# Patient Record
Sex: Male | Born: 1965 | Race: White | Hispanic: No | Marital: Married | Smoking: Current every day smoker
Health system: Southern US, Community
[De-identification: ages and names within clinical notes are randomized; demographics above are authoritative.]

## PROBLEM LIST (undated history)

## (undated) DIAGNOSIS — E119 Type 2 diabetes mellitus without complications: Secondary | ICD-10-CM

## (undated) DIAGNOSIS — E785 Hyperlipidemia, unspecified: Secondary | ICD-10-CM

## (undated) HISTORY — PX: OTHER SURGICAL HISTORY: SHX169

## (undated) HISTORY — DX: Hyperlipidemia, unspecified: E78.5

---

## 2010-03-19 ENCOUNTER — Emergency Department (HOSPITAL_COMMUNITY): Admission: EM | Admit: 2010-03-19 | Discharge: 2010-03-19 | Payer: Self-pay | Admitting: Emergency Medicine

## 2010-03-22 ENCOUNTER — Emergency Department (HOSPITAL_COMMUNITY): Admission: EM | Admit: 2010-03-22 | Discharge: 2010-03-22 | Payer: Self-pay | Admitting: Emergency Medicine

## 2011-10-18 ENCOUNTER — Emergency Department (HOSPITAL_COMMUNITY): Payer: PRIVATE HEALTH INSURANCE

## 2011-10-18 ENCOUNTER — Encounter: Payer: Self-pay | Admitting: *Deleted

## 2011-10-18 ENCOUNTER — Emergency Department (HOSPITAL_COMMUNITY)
Admission: EM | Admit: 2011-10-18 | Discharge: 2011-10-18 | Disposition: A | Discharge status: Home / Self Care | Payer: PRIVATE HEALTH INSURANCE | Attending: Emergency Medicine | Admitting: Emergency Medicine

## 2011-10-18 DIAGNOSIS — F17200 Nicotine dependence, unspecified, uncomplicated: Secondary | ICD-10-CM | POA: Insufficient documentation

## 2011-10-18 DIAGNOSIS — M79609 Pain in unspecified limb: Secondary | ICD-10-CM | POA: Insufficient documentation

## 2011-10-18 DIAGNOSIS — W2209XA Striking against other stationary object, initial encounter: Secondary | ICD-10-CM | POA: Insufficient documentation

## 2011-10-18 DIAGNOSIS — S60221A Contusion of right hand, initial encounter: Secondary | ICD-10-CM

## 2011-10-18 DIAGNOSIS — S60229A Contusion of unspecified hand, initial encounter: Secondary | ICD-10-CM | POA: Insufficient documentation

## 2011-10-18 NOTE — ED Notes (Signed)
Struck fist against wall 3-4 weeks ago. Cont to hurt when moves hand

## 2011-10-18 NOTE — ED Provider Notes (Signed)
History     CSN: [id number]  Arrival date & time 10/18/11  1238   First MD Initiated Contact with Patient 10/18/11 1304      Chief Complaint  Patient presents with  . Hand Injury    (Consider location/radiation/quality/duration/timing/severity/associated sxs/prior treatment) Patient is a 45 y.o. male presenting with hand injury. The history is provided by the patient. No language interpreter was used.  Hand Injury  Incident onset: 1 month ago. The incident occurred at home. Injury mechanism: punched a wall.  just started hurting a couple days ago. The pain is present in the right hand. The quality of the pain is described as sharp. The pain is at a severity of 6/10. The pain has been intermittent since the incident. He reports no foreign bodies present. The symptoms are aggravated by movement and palpation. He has tried nothing for the symptoms.    History reviewed. No pertinent past medical history.  History reviewed. No pertinent past surgical history.  History reviewed. No pertinent family history.  History  Substance Use Topics  . Smoking status: Current Everyday Smoker    Types: Cigarettes  . Smokeless tobacco: Not on file  . Alcohol Use: Yes      Review of Systems  Musculoskeletal:       Hand inj  All other systems reviewed and are negative.    Allergies  Review of patient's allergies indicates no known allergies.  Home Medications  No current outpatient prescriptions on file.  BP 142/85  Pulse 101  Temp 98.5 F (36.9 C)  Resp 20  Wt 218 lb (98.884 kg)  SpO2 99%  Physical Exam  Nursing note and vitals reviewed. Constitutional: He is oriented to person, place, and time. He appears well-developed and well-nourished.  HENT:  Head: Normocephalic and atraumatic.  Eyes: EOM are normal.  Neck: Normal range of motion.  Cardiovascular: Normal rate, regular rhythm, normal heart sounds and intact distal pulses.   Pulmonary/Chest: Effort normal and breath  sounds normal. No respiratory distress.  Abdominal: Soft. He exhibits no distension. There is no tenderness.  Musculoskeletal: He exhibits tenderness.       Right hand: He exhibits decreased range of motion, tenderness and bony tenderness. He exhibits no deformity, no laceration and no swelling. normal sensation noted. Normal strength noted.       Hands: Neurological: He is alert and oriented to person, place, and time.  Skin: Skin is warm and dry.  Psychiatric: He has a normal mood and affect. Judgment normal.    ED Course  Procedures (including critical care time)  Labs Reviewed - No data to display Dg Hand Complete Right  10/18/2011  *RADIOLOGY REPORT*  Clinical Data: Hit wall with wrist 3-4 weeks ago, persistent pain  RIGHT HAND - COMPLETE 3+ VIEW  Comparison: None.  Findings: No acute fracture is seen.   The radiocarpal joint space appears normal and the ulnar styloid is intact.  The carpal bones are in normal position.  Joint spaces appear normal.  IMPRESSION: No acute fracture.  Original Report Authenticated By: Juline Patch, M.D.     No diagnosis found.    MDM          Worthy Rancher, PA 10/18/11 1450

## 2011-10-18 NOTE — ED Provider Notes (Signed)
Medical screening examination/treatment/procedure(s) were performed by non-physician practitioner and as supervising physician I was immediately available for consultation/collaboration.  Nicoletta Dress. Colon Branch, MD 10/18/11 2133

## 2011-10-18 NOTE — ED Notes (Signed)
Pain rt hand ,  Hurts to move.

## 2013-03-06 ENCOUNTER — Other Ambulatory Visit: Payer: Self-pay

## 2013-03-06 MED ORDER — METFORMIN HCL 1000 MG PO TABS: 1000.0000 mg | ORAL_TABLET | Freq: Two times a day (BID) | ORAL | Status: DC

## 2013-03-06 NOTE — Telephone Encounter (Signed)
Patient has not been seen since 03/29/12

## 2013-04-12 ENCOUNTER — Other Ambulatory Visit: Payer: Self-pay | Admitting: Nurse Practitioner

## 2013-04-14 NOTE — Telephone Encounter (Signed)
LAST LABS 6/13. NTBS 

## 2013-05-21 ENCOUNTER — Other Ambulatory Visit: Payer: Self-pay | Admitting: Family Medicine

## 2013-05-23 NOTE — Telephone Encounter (Signed)
Last seen and last glucose  03/29/12  ACM

## 2013-05-24 ENCOUNTER — Encounter (HOSPITAL_COMMUNITY): Payer: Self-pay | Admitting: *Deleted

## 2013-05-24 ENCOUNTER — Emergency Department (HOSPITAL_COMMUNITY)
Admission: EM | Admit: 2013-05-24 | Discharge: 2013-05-24 | Disposition: A | Discharge status: Home / Self Care | Payer: Self-pay | Attending: Emergency Medicine | Admitting: Emergency Medicine

## 2013-05-24 ENCOUNTER — Emergency Department (HOSPITAL_COMMUNITY): Payer: Self-pay

## 2013-05-24 DIAGNOSIS — E119 Type 2 diabetes mellitus without complications: Secondary | ICD-10-CM | POA: Insufficient documentation

## 2013-05-24 DIAGNOSIS — Z79899 Other long term (current) drug therapy: Secondary | ICD-10-CM | POA: Insufficient documentation

## 2013-05-24 DIAGNOSIS — F17200 Nicotine dependence, unspecified, uncomplicated: Secondary | ICD-10-CM | POA: Insufficient documentation

## 2013-05-24 DIAGNOSIS — M5030 Other cervical disc degeneration, unspecified cervical region: Secondary | ICD-10-CM | POA: Insufficient documentation

## 2013-05-24 DIAGNOSIS — Z87828 Personal history of other (healed) physical injury and trauma: Secondary | ICD-10-CM | POA: Insufficient documentation

## 2013-05-24 DIAGNOSIS — R209 Unspecified disturbances of skin sensation: Secondary | ICD-10-CM | POA: Insufficient documentation

## 2013-05-24 DIAGNOSIS — M5412 Radiculopathy, cervical region: Secondary | ICD-10-CM | POA: Insufficient documentation

## 2013-05-24 DIAGNOSIS — M542 Cervicalgia: Secondary | ICD-10-CM | POA: Insufficient documentation

## 2013-05-24 DIAGNOSIS — M4722 Other spondylosis with radiculopathy, cervical region: Secondary | ICD-10-CM

## 2013-05-24 HISTORY — DX: Type 2 diabetes mellitus without complications: E11.9

## 2013-05-24 MED ORDER — HYDROCODONE-ACETAMINOPHEN 5-325 MG PO TABS: 1.0000 | ORAL_TABLET | ORAL | Status: DC | PRN

## 2013-05-24 MED ORDER — PREDNISONE 10 MG PO TABS: 20.0000 mg | ORAL_TABLET | Freq: Two times a day (BID) | ORAL | Status: DC

## 2013-05-24 NOTE — ED Notes (Signed)
Pt reporting pain in right shoulder, arm, and elbow for about 4 weeks.  Also reports occasional numbness in fingers of right hand.
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
## 2013-05-27 NOTE — ED Provider Notes (Signed)
Medical screening examination/treatment/procedure(s) were performed by non-physician practitioner and as supervising physician I was immediately available for consultation/collaboration.   Allyce Bochicchio M Timmie Dugue, DO 05/27/13 1305 

## 2013-06-25 ENCOUNTER — Other Ambulatory Visit: Payer: Self-pay | Admitting: Family Medicine

## 2013-06-26 NOTE — Telephone Encounter (Signed)
Last glucose 03/29/12  ACM

## 2013-07-02 ENCOUNTER — Ambulatory Visit (INDEPENDENT_AMBULATORY_CARE_PROVIDER_SITE_OTHER): Payer: BC Managed Care – PPO | Admitting: Family Medicine

## 2013-07-02 ENCOUNTER — Encounter: Payer: Self-pay | Admitting: Family Medicine

## 2013-07-02 VITALS — BP 123/75 | HR 79 | Temp 98.2°F | Ht 70.0 in | Wt 202.0 lb

## 2013-07-02 DIAGNOSIS — Z0000 Encounter for general adult medical examination without abnormal findings: Secondary | ICD-10-CM

## 2013-07-02 DIAGNOSIS — E119 Type 2 diabetes mellitus without complications: Secondary | ICD-10-CM

## 2013-07-02 DIAGNOSIS — N529 Male erectile dysfunction, unspecified: Secondary | ICD-10-CM

## 2013-07-02 LAB — POCT CBC
Granulocyte percent: 67.5 %G (ref 37–80)
HCT, POC: 46.9 % (ref 43.5–53.7)
Hemoglobin: 15.7 g/dL (ref 14.1–18.1)
Lymph, poc: 2.2 (ref 0.6–3.4)
MCH, POC: 32.1 pg — AB (ref 27–31.2)
MCHC: 33.6 g/dL (ref 31.8–35.4)
MCV: 95.4 fL (ref 80–97)
MPV: 7.3 fL (ref 0–99.8)
POC Granulocyte: 5.1 (ref 2–6.9)
POC LYMPH PERCENT: 28.5 %L (ref 10–50)
Platelet Count, POC: 234 10*3/uL (ref 142–424)
RBC: 4.9 M/uL (ref 4.69–6.13)
RDW, POC: 12.9 %
WBC: 7.6 10*3/uL (ref 4.6–10.2)

## 2013-07-02 LAB — POCT GLYCOSYLATED HEMOGLOBIN (HGB A1C): Hemoglobin A1C: 5.9

## 2013-07-02 MED ORDER — METFORMIN HCL 1000 MG PO TABS: 1000.0000 mg | ORAL_TABLET | Freq: Every day | ORAL | Status: DC

## 2013-07-02 MED ORDER — SILDENAFIL CITRATE 100 MG PO TABS: 50.0000 mg | ORAL_TABLET | Freq: Every day | ORAL | Status: DC | PRN

## 2013-07-02 NOTE — Patient Instructions (Addendum)
Smoking Cessation Quitting smoking is important to your health and has many advantages. However, it is not always easy to quit since nicotine is a very addictive drug. Often times, people try 3 times or more before being able to quit. This document explains the best ways for you to prepare to quit smoking. Quitting takes hard work and a lot of effort, but you can do it. ADVANTAGES OF QUITTING SMOKING  You will live longer, feel better, and live better.  Your body will feel the impact of quitting smoking almost immediately.  Within 20 minutes, blood pressure decreases. Your pulse returns to its normal level.  After 8 hours, carbon monoxide levels in the blood return to normal. Your oxygen level increases.  After 24 hours, the chance of having a heart attack starts to decrease. Your breath, hair, and body stop smelling like smoke.  After 48 hours, damaged nerve endings begin to recover. Your sense of taste and smell improve.  After 72 hours, the body is virtually free of nicotine. Your bronchial tubes relax and breathing becomes easier.  After 2 to 12 weeks, lungs can hold more air. Exercise becomes easier and circulation improves.  The risk of having a heart attack, stroke, cancer, or lung disease is greatly reduced.  After 1 year, the risk of coronary heart disease is cut in half.  After 5 years, the risk of stroke falls to the same as a nonsmoker.  After 10 years, the risk of lung cancer is cut in half and the risk of other cancers decreases significantly.  After 15 years, the risk of coronary heart disease drops, usually to the level of a nonsmoker.  If you are pregnant, quitting smoking will improve your chances of having a healthy baby.  The people you live with, especially any children, will be healthier.  You will have extra money to spend on things other than cigarettes. QUESTIONS TO THINK ABOUT BEFORE ATTEMPTING TO QUIT You may want to talk about your answers with your  caregiver.  Why do you want to quit?  If you tried to quit in the past, what helped and what did not?  What will be the most difficult situations for you after you quit? How will you plan to handle them?  Who can help you through the tough times? Your family? Friends? A caregiver?  What pleasures do you get from smoking? What ways can you still get pleasure if you quit? Here are some questions to ask your caregiver:  How can you help me to be successful at quitting?  What medicine do you think would be best for me and how should I take it?  What should I do if I need more help?  What is smoking withdrawal like? How can I get information on withdrawal? GET READY  Set a quit date.  Change your environment by getting rid of all cigarettes, ashtrays, matches, and lighters in your home, car, or work. Do not let people smoke in your home.  Review your past attempts to quit. Think about what worked and what did not. GET SUPPORT AND ENCOURAGEMENT You have a better chance of being successful if you have help. You can get support in many ways.  Tell your family, friends, and co-workers that you are going to quit and need their support. Ask them not to smoke around you.  Get individual, group, or telephone counseling and support. Programs are available at local hospitals and health centers. Call your local health department for   information about programs in your area.  Spiritual beliefs and practices may help some smokers quit.  Download a "quit meter" on your computer to keep track of quit statistics, such as how long you have gone without smoking, cigarettes not smoked, and money saved.  Get a self-help book about quitting smoking and staying off of tobacco. LEARN NEW SKILLS AND BEHAVIORS  Distract yourself from urges to smoke. Talk to someone, go for a walk, or occupy your time with a task.  Change your normal routine. Take a different route to work. Drink tea instead of coffee.  Eat breakfast in a different place.  Reduce your stress. Take a hot bath, exercise, or read a book.  Plan something enjoyable to do every day. Reward yourself for not smoking.  Explore interactive web-based programs that specialize in helping you quit. GET MEDICINE AND USE IT CORRECTLY Medicines can help you stop smoking and decrease the urge to smoke. Combining medicine with the above behavioral methods and support can greatly increase your chances of successfully quitting smoking.  Nicotine replacement therapy helps deliver nicotine to your body without the negative effects and risks of smoking. Nicotine replacement therapy includes nicotine gum, lozenges, inhalers, nasal sprays, and skin patches. Some may be available over-the-counter and others require a prescription.  Antidepressant medicine helps people abstain from smoking, but how this works is unknown. This medicine is available by prescription.  Nicotinic receptor partial agonist medicine simulates the effect of nicotine in your brain. This medicine is available by prescription. Ask your caregiver for advice about which medicines to use and how to use them based on your health history. Your caregiver will tell you what side effects to look out for if you choose to be on a medicine or therapy. Carefully read the information on the package. Do not use any other product containing nicotine while using a nicotine replacement product.  RELAPSE OR DIFFICULT SITUATIONS Most relapses occur within the first 3 months after quitting. Do not be discouraged if you start smoking again. Remember, most people try several times before finally quitting. You may have symptoms of withdrawal because your body is used to nicotine. You may crave cigarettes, be irritable, feel very hungry, cough often, get headaches, or have difficulty concentrating. The withdrawal symptoms are only temporary. They are strongest when you first quit, but they will go away within  10 14 days. To reduce the chances of relapse, try to:  Avoid drinking alcohol. Drinking lowers your chances of successfully quitting.  Reduce the amount of caffeine you consume. Once you quit smoking, the amount of caffeine in your body increases and can give you symptoms, such as a rapid heartbeat, sweating, and anxiety.  Avoid smokers because they can make you want to smoke.  Do not let weight gain distract you. Many smokers will gain weight when they quit, usually less than 10 pounds. Eat a healthy diet and stay active. You can always lose the weight gained after you quit.  Find ways to improve your mood other than smoking. FOR MORE INFORMATION  www.smokefree.gov  Document Released: 10/03/2001 Document Revised: 04/09/2012 Document Reviewed: 01/18/2012 [employer] Patient Information 2014 [employer].  

## 2013-07-02 NOTE — Progress Notes (Signed)
  Subjective:    Patient ID: Joshua Dudley, male    DOB: December 04, 1965, 48 y.o.   MRN: [id number]  HPI This 47 y.o. male presents for evaluation of diabetes.  He has Been taking metformin 1000mg  po bid and needs refills.  He has not Had labs in over a year.  He is a social drinker and drinks 2 beers after work Each day.  He is a cigarette smoker.   Review of Systems No chest pain, SOB, HA, dizziness, vision change, N/V, diarrhea, constipation, dysuria, urinary urgency or frequency, myalgias, arthralgias or rash.     Objective:   Physical Exam Vital signs noted  Well developed well nourished male.  HEENT - Head atraumatic Normocephalic                Eyes - PERRLA, Conjuctiva - clear Sclera- Clear EOMI                Ears - EAC's Wnl TM's Wnl Gross Hearing WNL                Nose - Nares patent                 Throat - oropharanx wnl Respiratory - Lungs CTA bilateral Cardiac - RRR S1 and S2 without murmur GI - Abdomen soft Nontender and bowel sounds active x 4 Extremities - No edema. Neuro - Grossly intact.       Assessment & Plan:  Diabetes - Plan: metFORMIN (GLUCOPHAGE) 1000 MG tablet, POCT glycosylated hemoglobin (Hb A1C), sildenafil (VIAGRA) 100 MG tablet  Routine general medical examination at a health care facility - Plan: POCT CBC, CMP14+EGFR, Lipid panel, PSA, total and free, Thyroid Panel With TSH, sildenafil (VIAGRA) 100 MG tablet. Discussed DC smoking.  Erectile dysfunction - Plan: sildenafil (VIAGRA) 100 MG tablet  Follow up in 4 months.

## 2013-07-03 ENCOUNTER — Other Ambulatory Visit: Payer: Self-pay | Admitting: Family Medicine

## 2013-07-03 LAB — CMP14+EGFR
ALT: 20 IU/L (ref 0–44)
AST: 16 IU/L (ref 0–40)
Albumin/Globulin Ratio: 1.8 (ref 1.1–2.5)
Albumin: 4.5 g/dL (ref 3.5–5.5)
Alkaline Phosphatase: 71 IU/L (ref 39–117)
BUN/Creatinine Ratio: 11 (ref 9–20)
BUN: 9 mg/dL (ref 6–24)
CO2: 26 mmol/L (ref 18–29)
Calcium: 9.7 mg/dL (ref 8.7–10.2)
Chloride: 98 mmol/L (ref 97–108)
Creatinine, Ser: 0.82 mg/dL (ref 0.76–1.27)
GFR calc Af Amer: 121 mL/min/{1.73_m2} (ref >59)
GFR calc non Af Amer: 105 mL/min/{1.73_m2} (ref >59)
Globulin, Total: 2.5 g/dL (ref 1.5–4.5)
Glucose: 116 mg/dL — ABNORMAL HIGH (ref 65–99)
Potassium: 5.1 mmol/L (ref 3.5–5.2)
Sodium: 139 mmol/L (ref 134–144)
Total Bilirubin: 0.5 mg/dL (ref 0.0–1.2)
Total Protein: 7 g/dL (ref 6.0–8.5)

## 2013-07-03 LAB — PSA, TOTAL AND FREE
PSA, Free Pct: 20 %
PSA, Free: 0.14 ng/mL
PSA: 0.7 ng/mL (ref 0.0–4.0)

## 2013-07-03 LAB — LIPID PANEL
Chol/HDL Ratio: 5.3 ratio units — ABNORMAL HIGH (ref 0.0–5.0)
Cholesterol, Total: 234 mg/dL — ABNORMAL HIGH (ref 100–199)
HDL: 44 mg/dL (ref >39)
LDL Calculated: 154 mg/dL — ABNORMAL HIGH (ref 0–99)
Triglycerides: 180 mg/dL — ABNORMAL HIGH (ref 0–149)
VLDL Cholesterol Cal: 36 mg/dL (ref 5–40)

## 2013-07-03 LAB — THYROID PANEL WITH TSH
Free Thyroxine Index: 1.5 (ref 1.2–4.9)
T3 Uptake Ratio: 25 % (ref 24–39)
T4, Total: 5.9 ug/dL (ref 4.5–12.0)
TSH: 1.94 u[IU]/mL (ref 0.450–4.500)

## 2013-07-03 MED ORDER — PRAVASTATIN SODIUM 40 MG PO TABS: 40.0000 mg | ORAL_TABLET | Freq: Every day | ORAL | Status: DC

## 2013-10-01 ENCOUNTER — Ambulatory Visit: Payer: BC Managed Care – PPO | Admitting: Family Medicine

## 2014-09-30 ENCOUNTER — Other Ambulatory Visit (HOSPITAL_COMMUNITY): Payer: Self-pay | Admitting: Respiratory Therapy

## 2014-09-30 DIAGNOSIS — G4730 Sleep apnea, unspecified: Secondary | ICD-10-CM

## 2014-10-09 ENCOUNTER — Ambulatory Visit: Payer: BC Managed Care – PPO | Attending: Neurology | Admitting: Sleep Medicine

## 2014-10-09 DIAGNOSIS — Z79899 Other long term (current) drug therapy: Secondary | ICD-10-CM | POA: Diagnosis not present

## 2014-10-09 DIAGNOSIS — G4730 Sleep apnea, unspecified: Secondary | ICD-10-CM

## 2014-10-09 DIAGNOSIS — G4733 Obstructive sleep apnea (adult) (pediatric): Secondary | ICD-10-CM | POA: Insufficient documentation

## 2014-10-14 NOTE — Sleep Study (Signed)
  [place] A. Merlene Laughter, MD     [email or web address]        NOCTURNAL POLYSOMNOGRAM    LOCATION: SLEEP LAB FACILITY: [facility]   PHYSICIAN: Marlean Mortell A. Merlene Laughter, M.D.   DATE OF STUDY: 10/09/2014.   REFERRING PHYSICIAN: Wiliam Cauthorn.  INDICATIONS: The patient is a 48 year old man who presents with snoring, witnessed apnea and daytime fatigue.   MEDICATIONS:  Prior to Admission medications   Medication Sig Start Date End Date Taking? Authorizing Provider  metFORMIN (GLUCOPHAGE) 1000 MG tablet Take 1 tablet (1,000 mg total) by mouth daily. 07/02/13   Lysbeth Penner, FNP  pravastatin (PRAVACHOL) 40 MG tablet Take 1 tablet (40 mg total) by mouth daily. 07/03/13   Lysbeth Penner, FNP  sildenafil (VIAGRA) 100 MG tablet Take 0.5-1 tablets (50-100 mg total) by mouth daily as needed for erectile dysfunction. 07/02/13   Lysbeth Penner, FNP      EPWORTH SLEEPINESS SCALE: 17.   BMI: 30.   ARCHITECTURAL SUMMARY: Total recording time was 407 minutes. Sleep efficiency 90 %. Sleep latency 9 minutes. REM latency 83 minutes. Stage NI 6 %, N2 54 % and N3 17 % and REM sleep 23 %.    RESPIRATORY DATA:  Baseline oxygen saturation is 95 %. The lowest saturation is 88 %. The diagnostic AHI is 10. The RDI is 14. The REM AHI is 15.  LIMB MOVEMENT SUMMARY: PLM index 0.   ELECTROCARDIOGRAM SUMMARY: Average heart rate is 96 with no significant dysrhythmias observed.   IMPRESSION:  1. 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 Consider positive pressure treatment.  Thanks for this referral.  Jerni Selmer A. Merlene Laughter, M.D. Diplomat, [employer] adviser of Sleep Medicine.

## 2015-03-06 IMAGING — CT CT CERVICAL SPINE W/O CM
3 of 4 series · 12 of 33 positions shown, 14 images · non-contrast
Comparison: None.

CLINICAL DATA: Neck and shoulder pain for 4 weeks.  Occasional
numbness of right hand.

CT CERVICAL SPINE WITHOUT CONTRAST
TECHNIQUE: Multidetector CT imaging of the cervical spine was
performed. Multiplanar CT image reconstructions were also
generated.

[Series 3: cervical 2.0 st axial · axial · 0.38mm/px · z∈[-64,+66]mm · 4 of 99 slices shown, 5 images]
[im 17/99  soft-tissue]
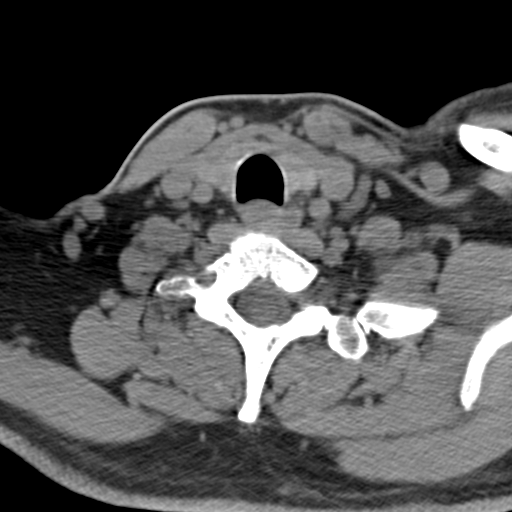
[im 17/99  bone]
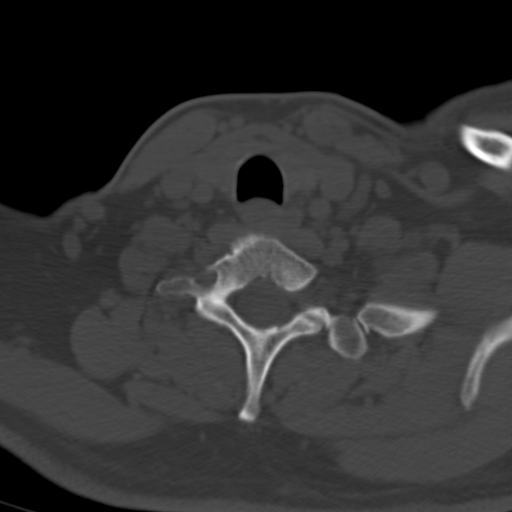
[im 33/99  bone]
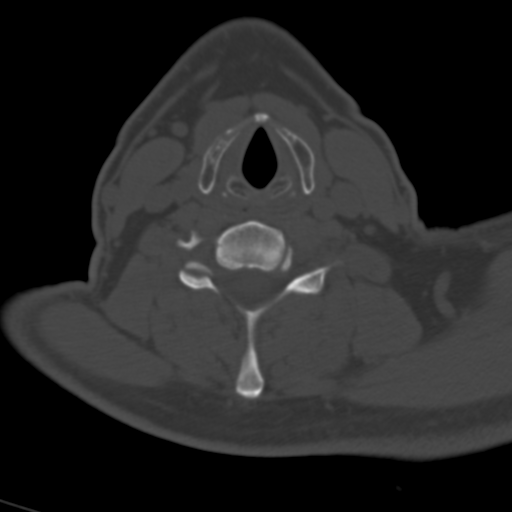
[im 66/99  bone]
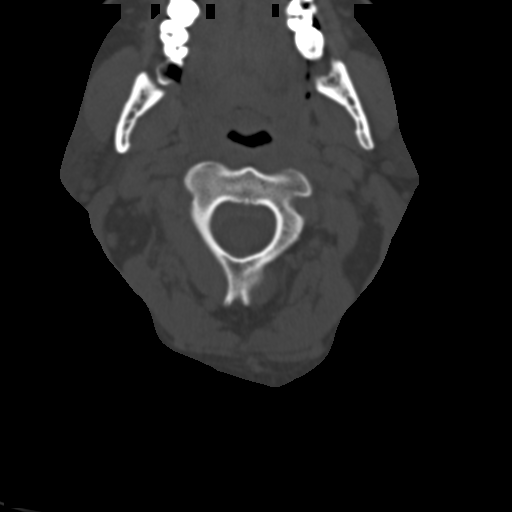
[im 82/99  bone]
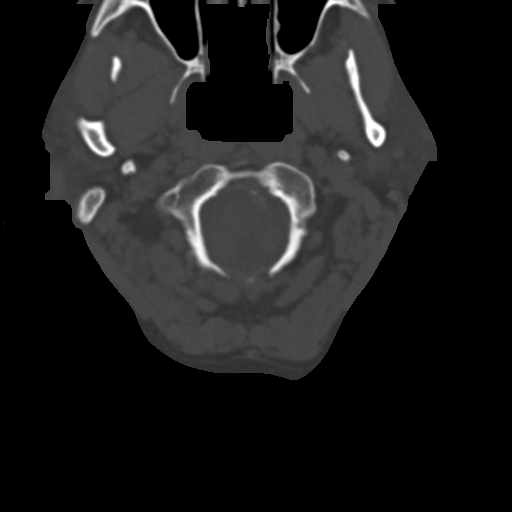

[Series 5: cervical spine sagittal bone · sagittal · 0.22mm/px · 5 of 90 slices shown, 6 images]
[im 30/90  bone]
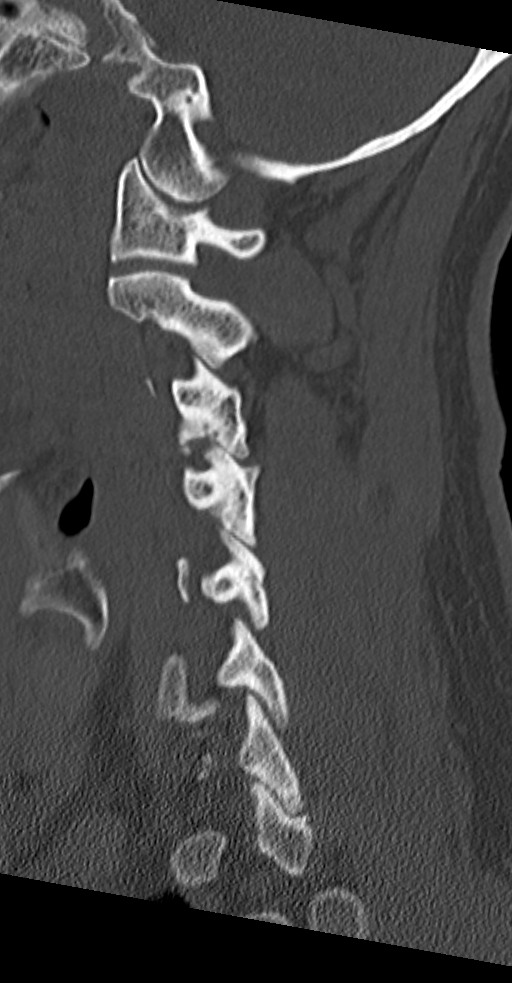
[im 38/90  bone]
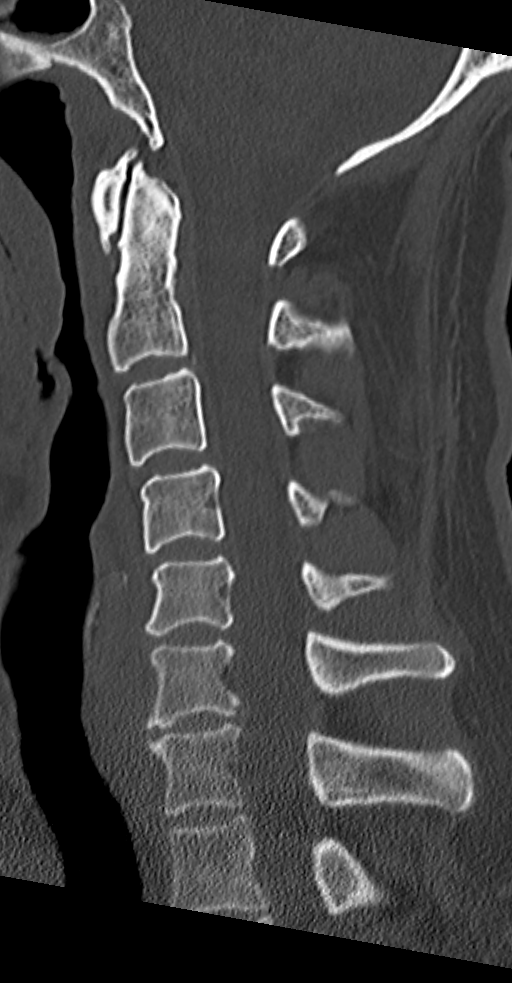
[im 45/90  soft-tissue]
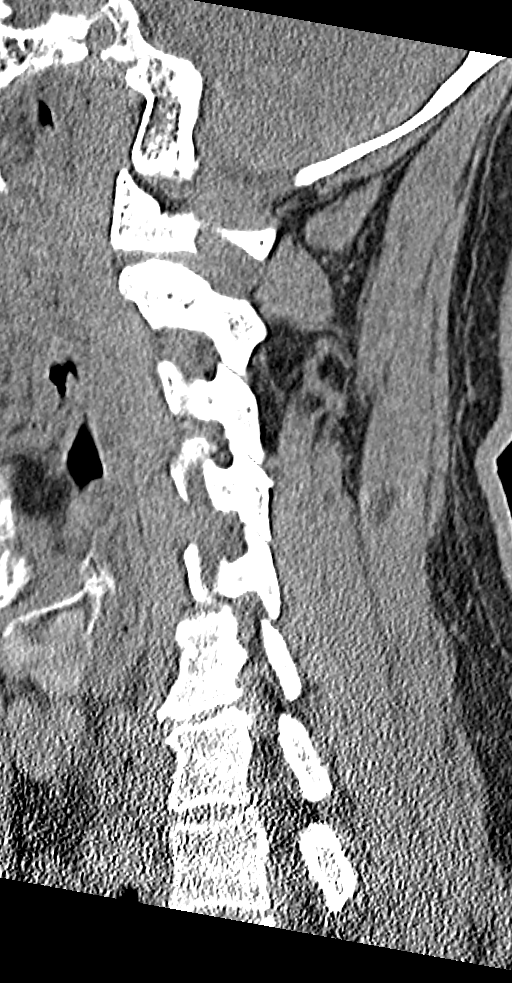
[im 45/90  bone]
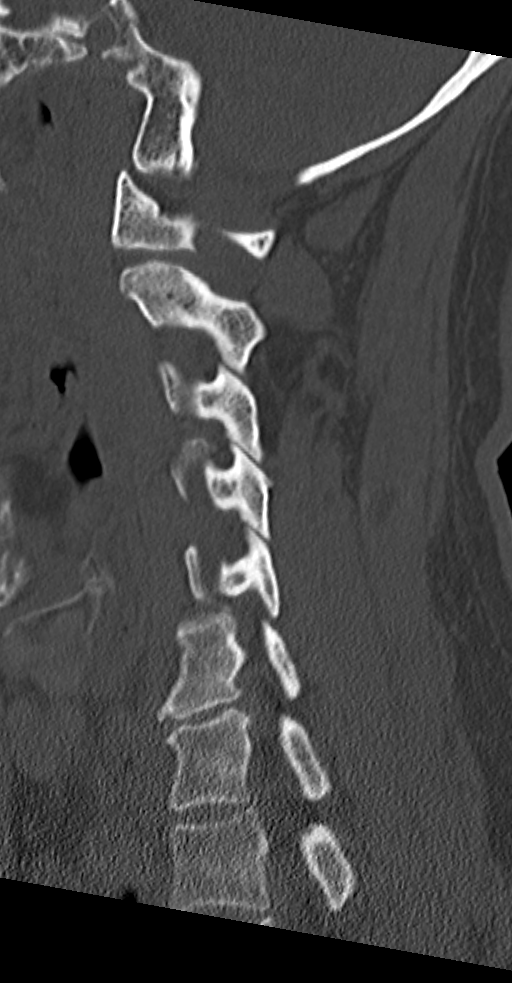
[im 52/90  bone]
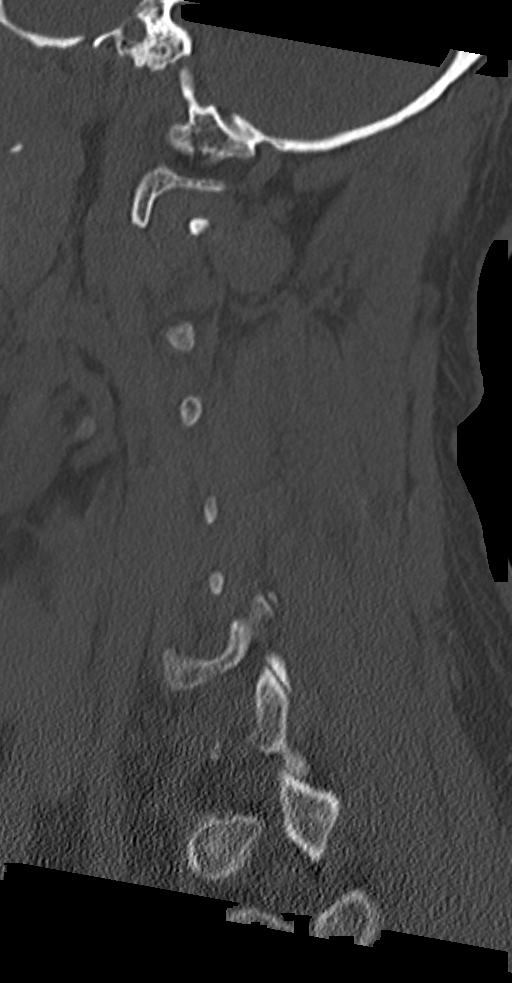
[im 60/90  bone]
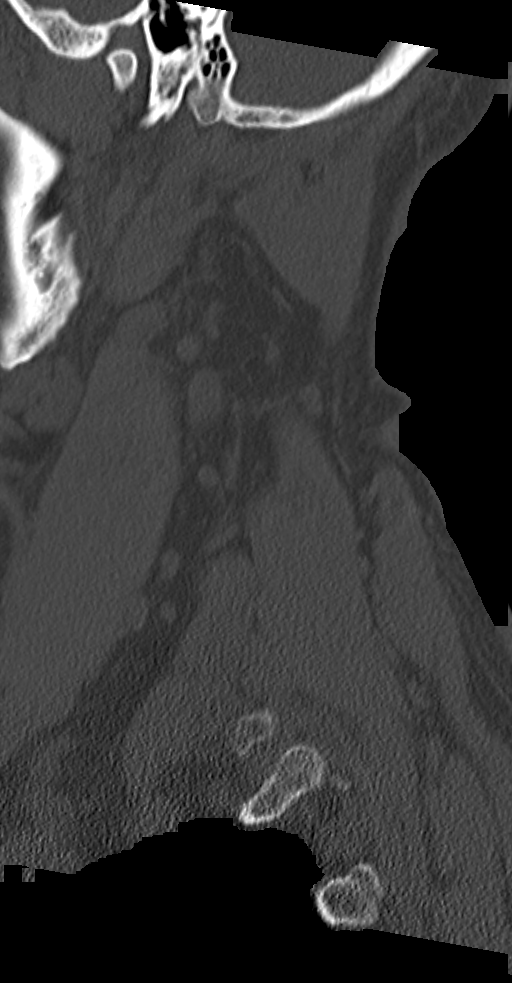

[Series 6: cervical spine coronal bone · coronal · 0.35mm/px · 3 of 76 slices shown]
[im 16/76  bone]
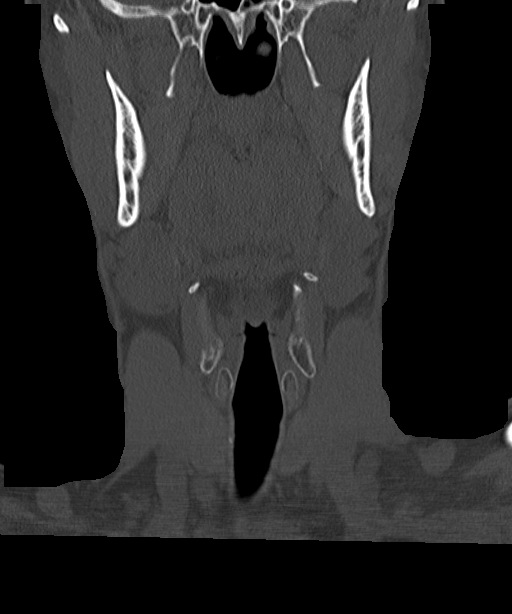
[im 31/76  bone]
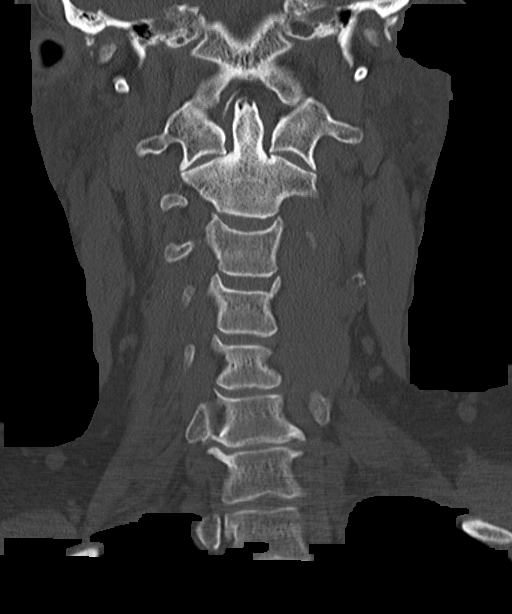
[im 46/76  bone]
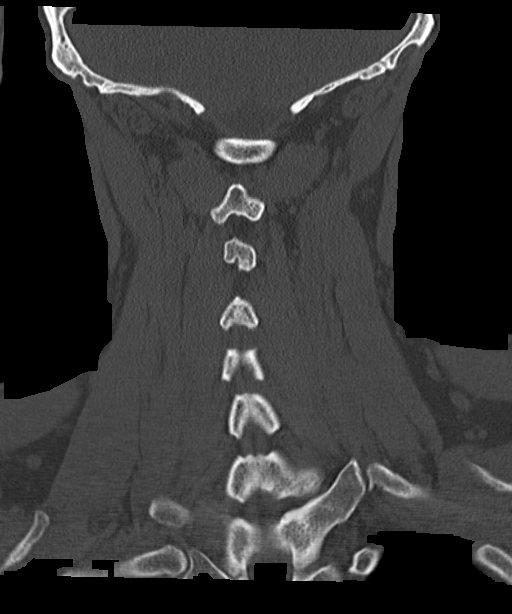

[12 of 33 positions shown; findings below may reference images not displayed]

FINDINGS: No acute fracture or subluxation.

Multilevel degenerative disc disease and facet osteoarthritis.
C3-4:  Trace anterolisthesis related to facet degeneration.  There
is moderate, right more than left, osseous foraminal stenosis
secondary to uncovertebral and facet spurring.
C6-7:  Disc space narrowing is most advanced at this level, with
posterior disc osteophyte complex narrowing the ventral canal.
Uncovertebral spurring and likely disc material encroaches on both
foramina.

No acute abnormality within the soft tissues of the imaged neck.

Although the dentition is incompletely imaged, there is extensive
dental caries of both maxillary and mandibular teeth.  Cervical
carotid atherosclerosis.
IMPRESSION: 1.  No acute cervical spine abnormality.
2.  Degenerative disc and facet disease with bilateral foraminal
stenoses at C3-4 and C6-7.

## 2015-09-15 ENCOUNTER — Telehealth: Payer: Self-pay | Admitting: General Practice

## 2015-09-15 NOTE — Telephone Encounter (Signed)
Patient received a letter from you to set up his screening tcs.  Please give him a call anytime at [phone].  Routing to [employer]

## 2015-09-21 ENCOUNTER — Telehealth: Payer: Self-pay

## 2015-09-21 NOTE — Telephone Encounter (Signed)
See separate triage.  

## 2015-09-21 NOTE — Telephone Encounter (Signed)
Patient needs OV to consider propofol given ETOH intake

## 2015-09-21 NOTE — Telephone Encounter (Signed)
Gastroenterology Pre-Procedure Review  Request Date: 09/21/2015 Requesting Physician: Dr. Wende Neighbors  PATIENT REVIEW QUESTIONS: The patient responded to the following health history questions as indicated:    1. Diabetes Melitis: no 2. Joint replacements in the past 12 months: no 3. Major health problems in the past 3 months: no 4. Has an artificial valve or MVP: no 5. Has a defibrillator: no 6. Has been advised in past to take antibiotics in advance of a procedure like teeth cleaning: no 7. Family history of colon cancer: no  8. Alcohol Use: YES    1-2 beers nightly and about a 12 pack on the week end 9. History of sleep apnea: no     MEDICATIONS & ALLERGIES:    Patient reports the following regarding taking any blood thinners:   Plavix? no Aspirin? no Coumadin? no  Patient confirms/reports the following medications:  Current Outpatient Prescriptions  Medication Sig Dispense Refill  . Multiple Vitamin (MULTIVITAMIN) tablet Take 1 tablet by mouth daily.    . sitaGLIPtin-metformin (JANUMET) 50-1000 MG tablet Take 1 tablet by mouth 2 (two) times daily with a meal.    . metFORMIN (GLUCOPHAGE) 1000 MG tablet Take 1 tablet (1,000 mg total) by mouth daily. (Patient not taking: Reported on 09/21/2015) 60 tablet 11  . pravastatin (PRAVACHOL) 40 MG tablet Take 1 tablet (40 mg total) by mouth daily. (Patient not taking: Reported on 09/21/2015) 90 tablet 3  . sildenafil (VIAGRA) 100 MG tablet Take 0.5-1 tablets (50-100 mg total) by mouth daily as needed for erectile dysfunction. 5 tablet 11   No current facility-administered medications for this visit.    Patient confirms/reports the following allergies:  No Known Allergies  No orders of the defined types were placed in this encounter.    AUTHORIZATION INFORMATION Primary Insurance:   ID #:   Group #:  Pre-Cert / Auth required Pre-Cert / Auth #:   Secondary Insurance:   ID #:   Group #:  Pre-Cert / Auth required:  Pre-Cert / Auth  #:   SCHEDULE INFORMATION: Procedure has been scheduled as follows:  Date:         Time:   Location:   This Gastroenterology Pre-Precedure Review Form is being routed to the following provider(s): R. Garfield Cornea, MD

## 2015-09-21 NOTE — Telephone Encounter (Signed)
Pt is scheduled and OV with Walden Field, NP on 10/05/2015 at 9:30 AM.

## 2015-10-05 ENCOUNTER — Ambulatory Visit: Payer: PRIVATE HEALTH INSURANCE | Admitting: Nurse Practitioner

## 2016-09-12 ENCOUNTER — Ambulatory Visit (HOSPITAL_COMMUNITY)
Admission: RE | Admit: 2016-09-12 | Discharge: 2016-09-12 | Disposition: A | Discharge status: Home / Self Care | Payer: BLUE CROSS/BLUE SHIELD | Source: Ambulatory Visit | Attending: Internal Medicine | Admitting: Internal Medicine

## 2016-09-12 ENCOUNTER — Other Ambulatory Visit (HOSPITAL_COMMUNITY): Payer: Self-pay | Admitting: Internal Medicine

## 2016-09-12 DIAGNOSIS — I6523 Occlusion and stenosis of bilateral carotid arteries: Secondary | ICD-10-CM

## 2018-04-27 IMAGING — US US CAROTID DUPLEX BILAT
1 series · 13 of 24 positions shown · non-contrast
Comparison: CT 05/24/2013

CLINICAL DATA: Carotid calcified plaque on cervical CT

EXAM:
BILATERAL CAROTID DUPLEX ULTRASOUND
TECHNIQUE: Gray scale imaging, color Doppler and duplex ultrasound was
performed of bilateral carotid and vertebral arteries in the neck.
TECHNIQUE: Quantification of carotid stenosis is based on velocity parameters
that correlate the residual internal carotid diameter with
NASCET-based stenosis levels, using the diameter of the distal
internal carotid lumen as the denominator for stenosis measurement.

[Series 1: us carotid duplex bilat · 0.06mm/px · 13 of 70 slices shown]
[im 1/70]
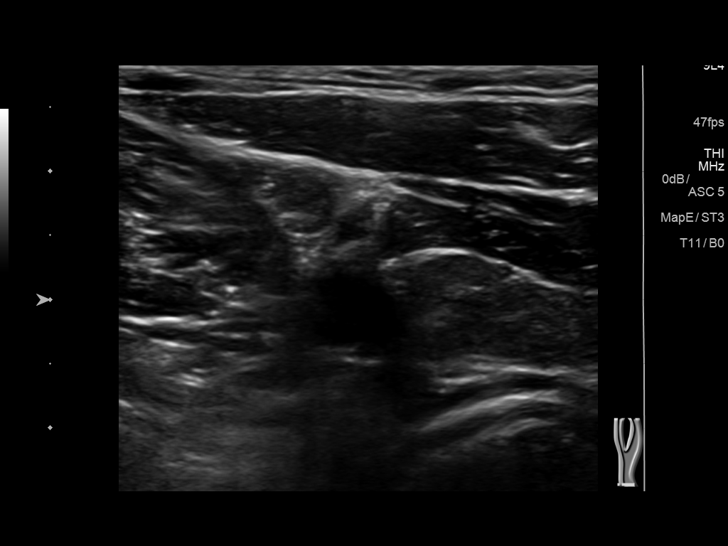
[im 7/70]
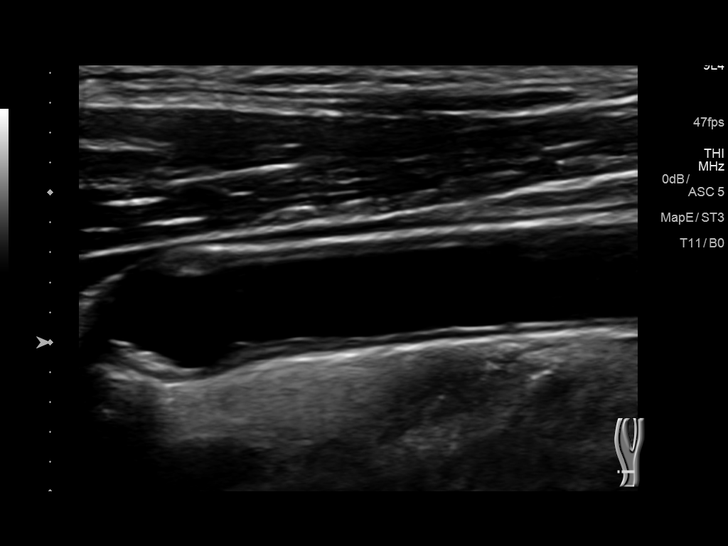
[im 13/70]
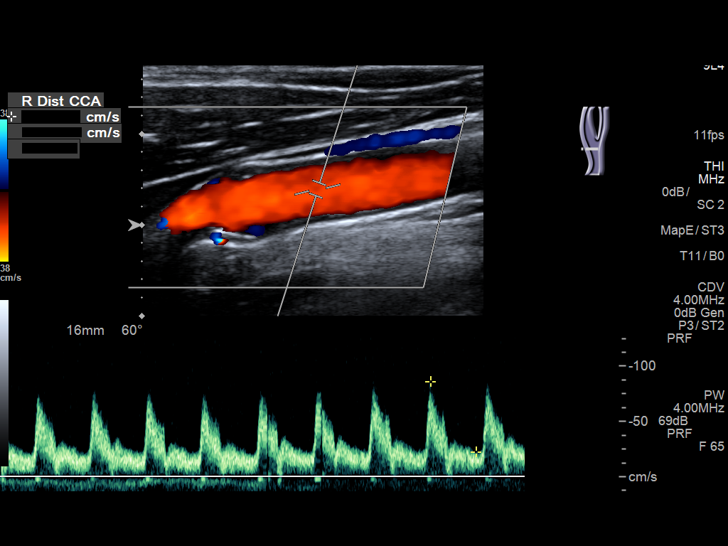
[im 19/70]
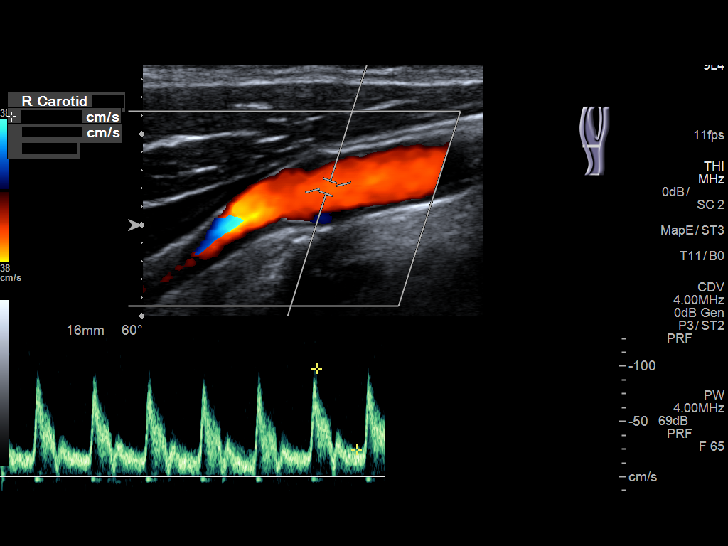
[im 25/70]
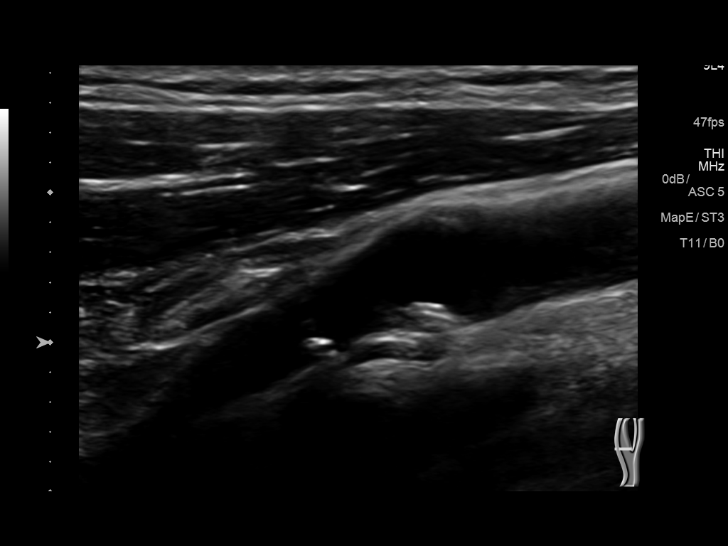
[im 31/70]
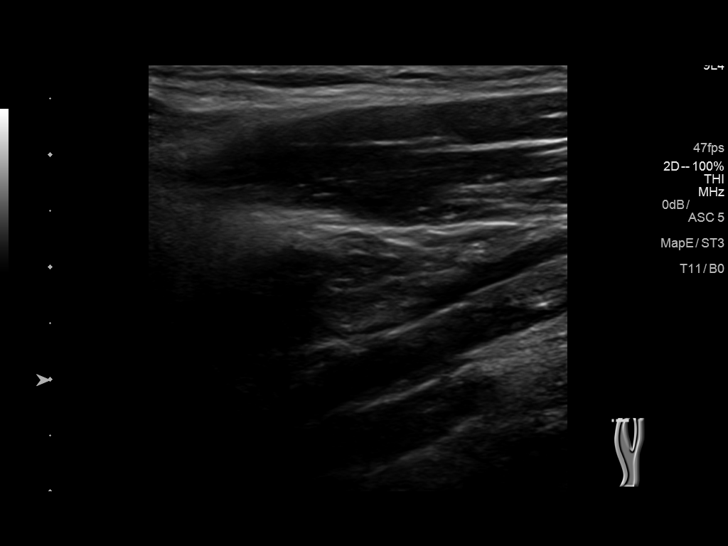
[im 37/70]
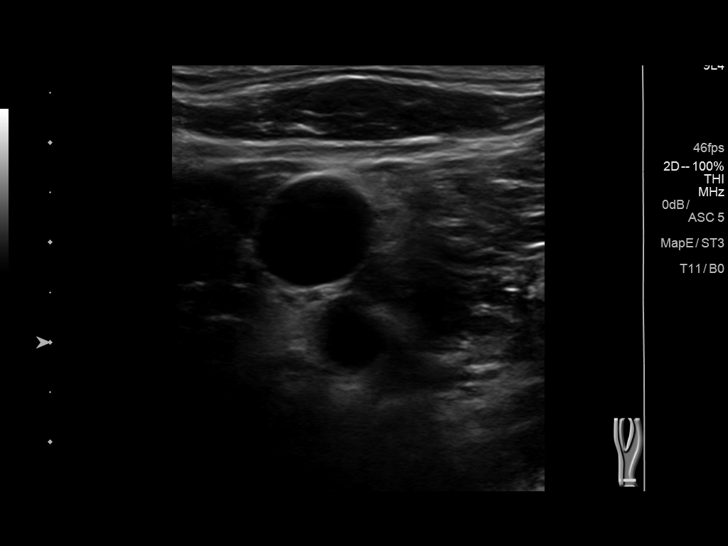
[im 40/70]
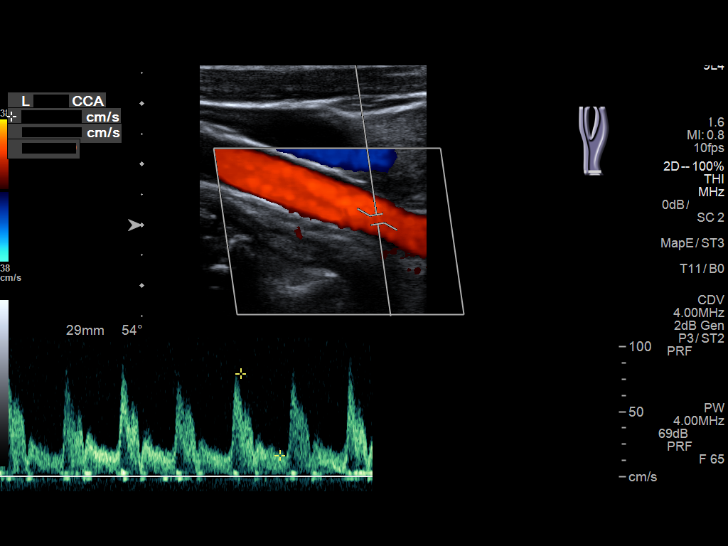
[im 46/70]
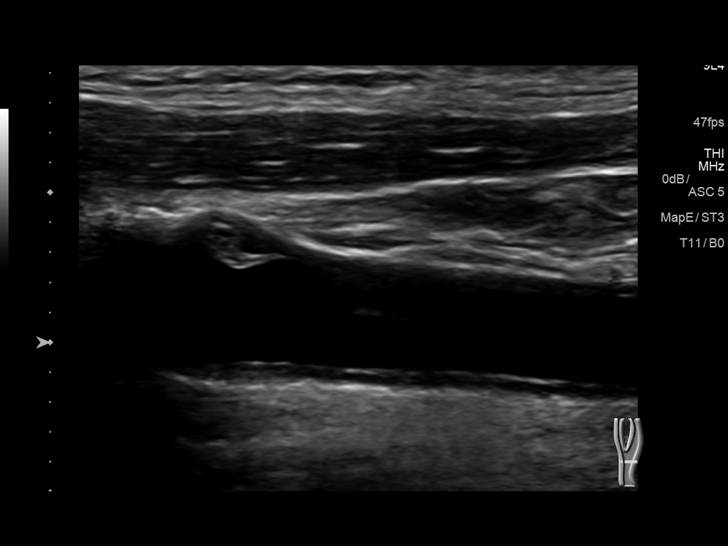
[im 52/70]
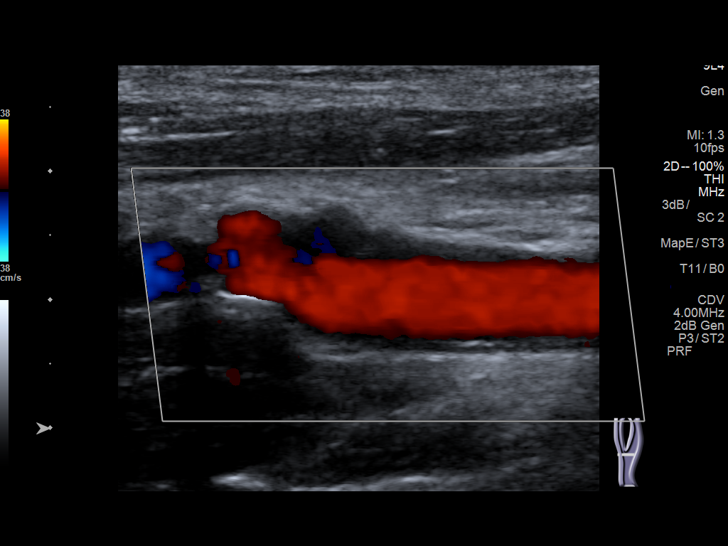
[im 58/70]
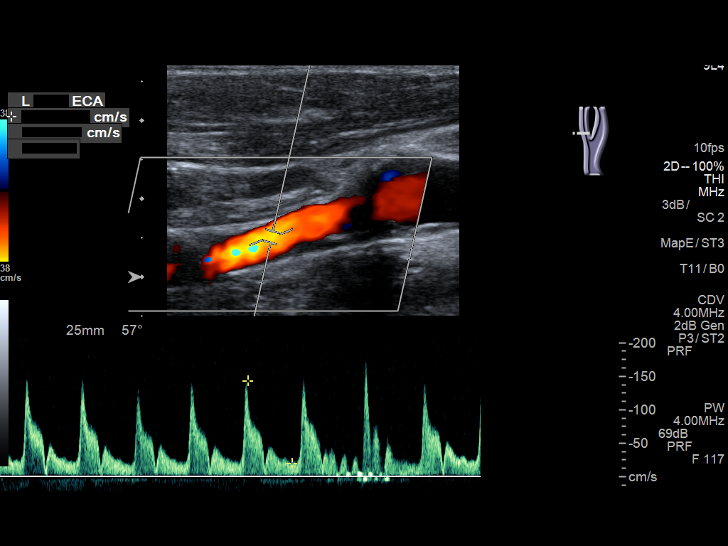
[im 64/70]
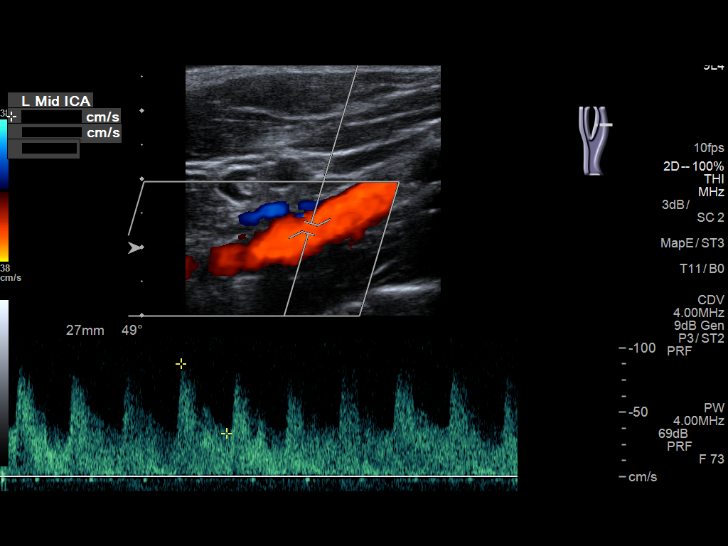
[im 70/70]
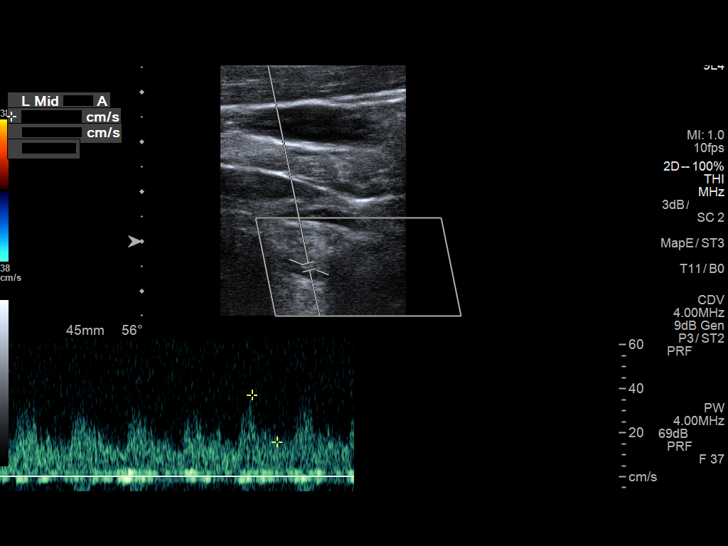

[13 of 24 positions shown; findings below may reference images not displayed]

The following velocity measurements were obtained:

PEAK SYSTOLIC/END DIASTOLIC

RIGHT

ICA:                     80/28cm/sec

CCA:                     80/21cm/sec

SYSTOLIC ICA/CCA RATIO:

DIASTOLIC ICA/CCA RATIO:

ECA:                     99cm/sec

LEFT

ICA:                     88/34cm/sec

CCA:                     87/25cm/sec

SYSTOLIC ICA/CCA RATIO:

DIASTOLIC ICA/CCA RATIO:

ECA:                     143cm/sec
FINDINGS: RIGHT CAROTID ARTERY: Mild partially calcified plaque in the carotid
bulb extending to the ICA origin. No high-grade stenosis. Normal
waveforms and color Doppler signal.

RIGHT VERTEBRAL ARTERY:  Normal flow direction and waveform.

LEFT CAROTID ARTERY: Eccentric partially calcified plaque in the
carotid bulb and proximal ICA resulting in at least mild stenosis.
Normal waveforms and color Doppler signal.

LEFT VERTEBRAL ARTERY: Normal flow direction and waveform.
IMPRESSION: 1. Bilateral carotid bifurcation and proximal ICA plaque, resulting
in less than 50% diameter stenosis. The exam does not exclude plaque
ulceration or embolization. Continued surveillance recommended.
2.  Antegrade bilateral vertebral arterial flow.

## 2018-07-24 ENCOUNTER — Other Ambulatory Visit (HOSPITAL_COMMUNITY): Payer: Self-pay | Admitting: Adult Health Nurse Practitioner

## 2018-07-24 DIAGNOSIS — R0989 Other specified symptoms and signs involving the circulatory and respiratory systems: Secondary | ICD-10-CM

## 2018-07-29 ENCOUNTER — Encounter (HOSPITAL_COMMUNITY): Payer: Self-pay

## 2018-07-29 ENCOUNTER — Ambulatory Visit (HOSPITAL_COMMUNITY)
Admission: RE | Admit: 2018-07-29 | Discharge: 2018-07-29 | Disposition: A | Discharge status: Home / Self Care | Payer: 59 | Source: Ambulatory Visit | Attending: Adult Health Nurse Practitioner | Admitting: Adult Health Nurse Practitioner

## 2018-08-01 ENCOUNTER — Encounter: Payer: Self-pay | Admitting: Gastroenterology

## 2018-08-08 ENCOUNTER — Ambulatory Visit (HOSPITAL_COMMUNITY): Admission: RE | Admit: 2018-08-08 | Payer: 59 | Source: Ambulatory Visit

## 2018-11-04 ENCOUNTER — Other Ambulatory Visit: Payer: Self-pay | Admitting: *Deleted

## 2018-11-04 ENCOUNTER — Encounter: Payer: Self-pay | Admitting: Gastroenterology

## 2018-11-04 ENCOUNTER — Encounter: Payer: Self-pay | Admitting: *Deleted

## 2018-11-04 ENCOUNTER — Telehealth: Payer: Self-pay | Admitting: *Deleted

## 2018-11-04 ENCOUNTER — Ambulatory Visit: Payer: 59 | Admitting: Gastroenterology

## 2018-11-04 VITALS — BP 123/81 | HR 103 | Temp 97.4°F | Ht 68.0 in | Wt 219.0 lb

## 2018-11-04 DIAGNOSIS — Z1211 Encounter for screening for malignant neoplasm of colon: Secondary | ICD-10-CM | POA: Diagnosis not present

## 2018-11-04 DIAGNOSIS — Z7289 Other problems related to lifestyle: Secondary | ICD-10-CM | POA: Diagnosis not present

## 2018-11-04 DIAGNOSIS — F1090 Alcohol use, unspecified, uncomplicated: Secondary | ICD-10-CM | POA: Insufficient documentation

## 2018-11-04 DIAGNOSIS — Z789 Other specified health status: Secondary | ICD-10-CM | POA: Insufficient documentation

## 2018-11-04 DIAGNOSIS — Z800 Family history of malignant neoplasm of digestive organs: Secondary | ICD-10-CM | POA: Diagnosis not present

## 2018-11-04 MED ORDER — PEG 3350-KCL-NA BICARB-NACL 420 G PO SOLR: 4000.0000 mL | Freq: Once | ORAL | 0 refills | Status: AC

## 2018-11-04 NOTE — Assessment & Plan Note (Signed)
Very pleasant 53 year old gentleman presenting to schedule first ever colonoscopy.  He believes his mother had colon cancer, she died before age 60. He denies bowel concerns.  Because of moderate alcohol use, would recommend deep sedation.  This was explained to the patient today.  He is in agreement.  I have discussed the risks, alternatives, benefits with regards to but not limited to the risk of reaction to medication, bleeding, infection, perforation and the patient is agreeable to proceed. Written consent to be obtained.

## 2018-11-04 NOTE — Telephone Encounter (Signed)
Pre-op scheduled for 11/22/2018 at 2:45pm. Patient aware. Letter mailed

## 2018-11-04 NOTE — Progress Notes (Signed)
CC'D TO PCP °

## 2018-11-04 NOTE — Progress Notes (Signed)
Primary Care Physician:  Celene Squibb, MD  Primary Gastroenterologist:  Garfield Cornea, MD   Chief Complaint  Patient presents with  . Colonoscopy    consult, never had tcs    HPI:  Joshua Dudley is a 53 y.o. male here to schedule first ever colonoscopy at the request of Dr. Nevada Crane.  He believes his mother had colon cancer, she died before she was 56.  He does not know all the details but states she had surgery and adjuvant therapy but cancer returned and she refused further treatments.  Patient denies any bowel concerns.  No constipation, diarrhea, melena, rectal bleeding, abdominal pain, upper GI symptoms.  He was brought in to discuss sedation given his daily alcohol use.  He reports approximately 1 beer per day at this time.  In previous parts of his records from PCP it mentions 3 beers per day.  Current Outpatient Medications  Medication Sig Dispense Refill  . atorvastatin (LIPITOR) 10 MG tablet Take 10 mg by mouth daily.    . Multiple Vitamin (MULTIVITAMIN) tablet Take 1 tablet by mouth daily.    . sitaGLIPtin-metformin (JANUMET) 50-1000 MG tablet Take 1 tablet by mouth 2 (two) times daily with a meal.     No current facility-administered medications for this visit.     Allergies as of 11/04/2018 - Review Complete 11/04/2018  Allergen Reaction Noted  . Other  11/04/2018    Past Medical History:  Diagnosis Date  . Diabetes mellitus without complication ([place])   . Hyperlipidemia     Past Surgical History:  Procedure Laterality Date  . none      Family History  Problem Relation Age of Onset  . Cancer Mother        ?colon cancer <age 87  . Heart disease Father   . Hypertension Sister   . Stroke Sister     Social History   Socioeconomic History  . Marital status: Married    Spouse name: Not on file  . Number of children: Not on file  . Years of education: Not on file  . Highest education level: Not on file  Occupational History  . Not on file  Social Needs   . Financial resource strain: Not on file  . Food insecurity:    Worry: Not on file    Inability: Not on file  . Transportation needs:    Medical: Not on file    Non-medical: Not on file  Tobacco Use  . Smoking status: Current Every Day Smoker    Packs/day: 1.00    Types: Cigarettes  . Smokeless tobacco: Former Network engineer and Sexual Activity  . Alcohol use: Yes    Comment: average 1 beer/day  . Drug use: No  . Sexual activity: Not on file  Lifestyle  . Physical activity:    Days per week: Not on file    Minutes per session: Not on file  . Stress: Not on file  Relationships  . Social connections:    Talks on phone: Not on file    Gets together: Not on file    Attends religious service: Not on file    Active member of club or organization: Not on file    Attends meetings of clubs or organizations: Not on file    Relationship status: Not on file  . Intimate partner violence:    Fear of current or ex partner: Not on file    Emotionally abused: Not on file  Physically abused: Not on file    Forced sexual activity: Not on file  Other Topics Concern  . Not on file  Social History Narrative  . Not on file      ROS:  General: Negative for anorexia, weight loss, fever, chills, fatigue, weakness. Eyes: Negative for vision changes.  ENT: Negative for hoarseness, difficulty swallowing , nasal congestion. CV: Negative for chest pain, angina, palpitations, dyspnea on exertion, peripheral edema.  Respiratory: Negative for dyspnea at rest, dyspnea on exertion, cough, sputum, wheezing.  GI: See history of present illness. GU:  Negative for dysuria, hematuria, urinary incontinence, urinary frequency, nocturnal urination.  MS: Negative for joint pain, low back pain.  Derm: Negative for rash or itching.  Neuro: Negative for weakness, abnormal sensation, seizure, frequent headaches, memory loss, confusion.  Psych: Negative for anxiety, depression, suicidal ideation,  hallucinations.  Endo: Negative for unusual weight change.  Heme: Negative for bruising or bleeding. Allergy: Negative for rash or hives.    Physical Examination:  BP 123/81   Pulse (!) 103   Temp (!) 97.4 F (36.3 C) (Oral)   Ht 5\' 8"  (1.727 m)   Wt 219 lb (99.3 kg)   BMI 33.30 kg/m    General: Well-nourished, well-developed in no acute distress.  Head: Normocephalic, atraumatic.   Eyes: Conjunctiva pink, no icterus. Mouth: Oropharyngeal mucosa moist and pink , no lesions erythema or exudate. Neck: Supple without thyromegaly, masses, or lymphadenopathy.  Lungs: Clear to auscultation bilaterally.  Heart: Regular rate and rhythm, no murmurs rubs or gallops.  Abdomen: Bowel sounds are normal, nontender, nondistended, no hepatosplenomegaly or masses, no abdominal bruits or    hernia , no rebound or guarding.   Rectal:not Performed Extremities: No lower extremity edema. No clubbing or deformities.  Neuro: Alert and oriented x 4 , grossly normal neurologically.  Skin: Warm and dry, no rash or jaundice.   Psych: Alert and cooperative, normal mood and affect.

## 2018-11-04 NOTE — H&P (View-Only) (Signed)
Primary Care Physician:  Celene Squibb, MD  Primary Gastroenterologist:  Garfield Cornea, MD   Chief Complaint  Patient presents with  . Colonoscopy    consult, never had tcs    HPI:  Joshua Dudley is a 53 y.o. male here to schedule first ever colonoscopy at the request of Dr. Nevada Crane.  He believes his mother had colon cancer, she died before she was 37.  He does not know all the details but states she had surgery and adjuvant therapy but cancer returned and she refused further treatments.  Patient denies any bowel concerns.  No constipation, diarrhea, melena, rectal bleeding, abdominal pain, upper GI symptoms.  He was brought in to discuss sedation given his daily alcohol use.  He reports approximately 1 beer per day at this time.  In previous parts of his records from PCP it mentions 3 beers per day.  Current Outpatient Medications  Medication Sig Dispense Refill  . atorvastatin (LIPITOR) 10 MG tablet Take 10 mg by mouth daily.    . Multiple Vitamin (MULTIVITAMIN) tablet Take 1 tablet by mouth daily.    . sitaGLIPtin-metformin (JANUMET) 50-1000 MG tablet Take 1 tablet by mouth 2 (two) times daily with a meal.     No current facility-administered medications for this visit.     Allergies as of 11/04/2018 - Review Complete 11/04/2018  Allergen Reaction Noted  . Other  11/04/2018    Past Medical History:  Diagnosis Date  . Diabetes mellitus without complication ([place])   . Hyperlipidemia     Past Surgical History:  Procedure Laterality Date  . none      Family History  Problem Relation Age of Onset  . Cancer Mother        ?colon cancer <age 29  . Heart disease Father   . Hypertension Sister   . Stroke Sister     Social History   Socioeconomic History  . Marital status: Married    Spouse name: Not on file  . Number of children: Not on file  . Years of education: Not on file  . Highest education level: Not on file  Occupational History  . Not on file  Social Needs   . Financial resource strain: Not on file  . Food insecurity:    Worry: Not on file    Inability: Not on file  . Transportation needs:    Medical: Not on file    Non-medical: Not on file  Tobacco Use  . Smoking status: Current Every Day Smoker    Packs/day: 1.00    Types: Cigarettes  . Smokeless tobacco: Former Network engineer and Sexual Activity  . Alcohol use: Yes    Comment: average 1 beer/day  . Drug use: No  . Sexual activity: Not on file  Lifestyle  . Physical activity:    Days per week: Not on file    Minutes per session: Not on file  . Stress: Not on file  Relationships  . Social connections:    Talks on phone: Not on file    Gets together: Not on file    Attends religious service: Not on file    Active member of club or organization: Not on file    Attends meetings of clubs or organizations: Not on file    Relationship status: Not on file  . Intimate partner violence:    Fear of current or ex partner: Not on file    Emotionally abused: Not on file  Physically abused: Not on file    Forced sexual activity: Not on file  Other Topics Concern  . Not on file  Social History Narrative  . Not on file      ROS:  General: Negative for anorexia, weight loss, fever, chills, fatigue, weakness. Eyes: Negative for vision changes.  ENT: Negative for hoarseness, difficulty swallowing , nasal congestion. CV: Negative for chest pain, angina, palpitations, dyspnea on exertion, peripheral edema.  Respiratory: Negative for dyspnea at rest, dyspnea on exertion, cough, sputum, wheezing.  GI: See history of present illness. GU:  Negative for dysuria, hematuria, urinary incontinence, urinary frequency, nocturnal urination.  MS: Negative for joint pain, low back pain.  Derm: Negative for rash or itching.  Neuro: Negative for weakness, abnormal sensation, seizure, frequent headaches, memory loss, confusion.  Psych: Negative for anxiety, depression, suicidal ideation,  hallucinations.  Endo: Negative for unusual weight change.  Heme: Negative for bruising or bleeding. Allergy: Negative for rash or hives.    Physical Examination:  BP 123/81   Pulse (!) 103   Temp (!) 97.4 F (36.3 C) (Oral)   Ht 5\' 8"  (1.727 m)   Wt 219 lb (99.3 kg)   BMI 33.30 kg/m    General: Well-nourished, well-developed in no acute distress.  Head: Normocephalic, atraumatic.   Eyes: Conjunctiva pink, no icterus. Mouth: Oropharyngeal mucosa moist and pink , no lesions erythema or exudate. Neck: Supple without thyromegaly, masses, or lymphadenopathy.  Lungs: Clear to auscultation bilaterally.  Heart: Regular rate and rhythm, no murmurs rubs or gallops.  Abdomen: Bowel sounds are normal, nontender, nondistended, no hepatosplenomegaly or masses, no abdominal bruits or    hernia , no rebound or guarding.   Rectal:not Performed Extremities: No lower extremity edema. No clubbing or deformities.  Neuro: Alert and oriented x 4 , grossly normal neurologically.  Skin: Warm and dry, no rash or jaundice.   Psych: Alert and cooperative, normal mood and affect.

## 2018-11-04 NOTE — Patient Instructions (Signed)
Colonoscopy as scheduled.  Please see separate instructions.

## 2018-11-22 ENCOUNTER — Encounter (HOSPITAL_COMMUNITY)
Admission: RE | Admit: 2018-11-22 | Discharge: 2018-11-22 | Disposition: A | Discharge status: Home / Self Care | Payer: 59 | Source: Ambulatory Visit | Attending: Internal Medicine | Admitting: Internal Medicine

## 2018-11-28 ENCOUNTER — Ambulatory Visit (HOSPITAL_COMMUNITY): Payer: 59 | Admitting: Anesthesiology

## 2018-11-28 ENCOUNTER — Ambulatory Visit (HOSPITAL_COMMUNITY)
Admission: RE | Admit: 2018-11-28 | Discharge: 2018-11-28 | Disposition: A | Discharge status: Home / Self Care | Payer: 59 | Attending: Internal Medicine | Admitting: Internal Medicine

## 2018-11-28 ENCOUNTER — Encounter (HOSPITAL_COMMUNITY): Payer: Self-pay | Admitting: Anesthesiology

## 2018-11-28 ENCOUNTER — Encounter (HOSPITAL_COMMUNITY)
Admission: RE | Disposition: A | Discharge status: Home / Self Care | Payer: Self-pay | Source: Home / Self Care | Attending: Internal Medicine

## 2018-11-28 DIAGNOSIS — D123 Benign neoplasm of transverse colon: Secondary | ICD-10-CM

## 2018-11-28 DIAGNOSIS — E119 Type 2 diabetes mellitus without complications: Secondary | ICD-10-CM | POA: Insufficient documentation

## 2018-11-28 DIAGNOSIS — Z8249 Family history of ischemic heart disease and other diseases of the circulatory system: Secondary | ICD-10-CM | POA: Insufficient documentation

## 2018-11-28 DIAGNOSIS — D122 Benign neoplasm of ascending colon: Secondary | ICD-10-CM | POA: Insufficient documentation

## 2018-11-28 DIAGNOSIS — Z79899 Other long term (current) drug therapy: Secondary | ICD-10-CM | POA: Diagnosis not present

## 2018-11-28 DIAGNOSIS — Z800 Family history of malignant neoplasm of digestive organs: Secondary | ICD-10-CM | POA: Insufficient documentation

## 2018-11-28 DIAGNOSIS — Z1211 Encounter for screening for malignant neoplasm of colon: Secondary | ICD-10-CM | POA: Diagnosis present

## 2018-11-28 DIAGNOSIS — K5730 Diverticulosis of large intestine without perforation or abscess without bleeding: Secondary | ICD-10-CM | POA: Diagnosis not present

## 2018-11-28 DIAGNOSIS — F17210 Nicotine dependence, cigarettes, uncomplicated: Secondary | ICD-10-CM | POA: Insufficient documentation

## 2018-11-28 DIAGNOSIS — K648 Other hemorrhoids: Secondary | ICD-10-CM | POA: Insufficient documentation

## 2018-11-28 DIAGNOSIS — E785 Hyperlipidemia, unspecified: Secondary | ICD-10-CM | POA: Insufficient documentation

## 2018-11-28 DIAGNOSIS — Z7984 Long term (current) use of oral hypoglycemic drugs: Secondary | ICD-10-CM | POA: Insufficient documentation

## 2018-11-28 HISTORY — PX: POLYPECTOMY: SHX5525

## 2018-11-28 HISTORY — PX: COLONOSCOPY WITH PROPOFOL: SHX5780

## 2018-11-28 LAB — GLUCOSE, CAPILLARY
Glucose-Capillary: 134 mg/dL — ABNORMAL HIGH (ref 70–99)
Glucose-Capillary: 126 mg/dL — ABNORMAL HIGH (ref 70–99)

## 2018-11-28 SURGERY — COLONOSCOPY WITH PROPOFOL
Anesthesia: Monitor Anesthesia Care

## 2018-11-28 MED ORDER — PROPOFOL 10 MG/ML IV BOLUS
INTRAVENOUS | Status: AC
  Filled 2018-11-28: qty 40

## 2018-11-28 MED ORDER — CHLORHEXIDINE GLUCONATE CLOTH 2 % EX PADS: 6.0000 | MEDICATED_PAD | Freq: Once | CUTANEOUS | Status: DC

## 2018-11-28 MED ORDER — PROPOFOL 500 MG/50ML IV EMUL
INTRAVENOUS | Status: DC | PRN
  Administered 2018-11-28: 150 ug/kg/min via INTRAVENOUS

## 2018-11-28 MED ORDER — GLYCOPYRROLATE 0.2 MG/ML IJ SOLN
INTRAMUSCULAR | Status: DC | PRN
  Administered 2018-11-28: 0.2 mg via INTRAVENOUS

## 2018-11-28 MED ORDER — LACTATED RINGERS IV SOLN
INTRAVENOUS | Status: DC
  Administered 2018-11-28: 1000 mL via INTRAVENOUS

## 2018-11-28 MED ORDER — LIDOCAINE HCL (CARDIAC) PF 100 MG/5ML IV SOSY
PREFILLED_SYRINGE | INTRAVENOUS | Status: DC | PRN
  Administered 2018-11-28: 40 mg via INTRAVENOUS

## 2018-11-28 MED ORDER — PROPOFOL 10 MG/ML IV BOLUS
INTRAVENOUS | Status: DC | PRN
  Administered 2018-11-28 (×2): 20 mg via INTRAVENOUS

## 2018-11-28 NOTE — Anesthesia Postprocedure Evaluation (Signed)
Anesthesia Post Note  Patient: Joshua Dudley  Procedure(s) Performed: COLONOSCOPY WITH PROPOFOL (N/A ) POLYPECTOMY  Patient location during evaluation: PACU Anesthesia Type: MAC Level of consciousness: awake and alert and oriented Pain management: pain level controlled Vital Signs Assessment: post-procedure vital signs reviewed and stable Respiratory status: spontaneous breathing Cardiovascular status: stable Postop Assessment: no apparent nausea or vomiting Anesthetic complications: no     Last Vitals:  Vitals:   11/28/18 0717  BP: 119/84  Resp: 18  Temp: 36.8 C  SpO2: 99%    Last Pain:  Vitals:   11/28/18 0758  TempSrc:   PainSc: 0-No pain                 Orla Jolliff A

## 2018-11-28 NOTE — Transfer of Care (Signed)
Immediate Anesthesia Transfer of Care Note  Patient: Joshua Dudley  Procedure(s) Performed: COLONOSCOPY WITH PROPOFOL (N/A ) POLYPECTOMY  Patient Location: PACU  Anesthesia Type:MAC  Level of Consciousness: awake, alert , oriented and patient cooperative  Airway & Oxygen Therapy: Patient Spontanous Breathing  Post-op Assessment: Report given to RN and Post -op Vital signs reviewed and stable  Post vital signs: Reviewed and stable  Last Vitals:  Vitals Value Taken Time  BP 100/74 11/28/2018  8:23 AM  Temp    Pulse 90 11/28/2018  8:24 AM  Resp 23 11/28/2018  8:24 AM  SpO2 97 % 11/28/2018  8:24 AM  Vitals shown include unvalidated device data.  Last Pain:  Vitals:   11/28/18 0758  TempSrc:   PainSc: 0-No pain         Complications: No apparent anesthesia complications

## 2018-11-28 NOTE — Interval H&P Note (Signed)
History and Physical Interval Note:  11/28/2018 7:29 AM  Joshua Dudley  has presented today for surgery, with the diagnosis of screening colonoscopy  The various methods of treatment have been discussed with the patient and family. After consideration of risks, benefits and other options for treatment, the patient has consented to  Procedure(s) with comments: COLONOSCOPY WITH PROPOFOL (N/A) - 8:15am as a surgical intervention .  The patient's history has been reviewed, patient examined, no change in status, stable for surgery.  I have reviewed the patient's chart and labs.  Questions were answered to the patient's satisfaction.     Joshua Dudley  No change.  Patient feels confident mother,in deed, had  colon cancer.  Here for first ever high risk screening colonoscopy per plan.  The risks, benefits, limitations, alternatives and imponderables have been reviewed with the patient. Questions have been answered. All parties are agreeable.

## 2018-11-28 NOTE — Anesthesia Procedure Notes (Signed)
Procedure Name: MAC Date/Time: 11/28/2018 7:57 AM Performed by: Andree Elk Amy A, CRNA Pre-anesthesia Checklist: Patient identified, Emergency Drugs available, Suction available, Timeout performed and Patient being monitored Patient Re-evaluated:Patient Re-evaluated prior to induction Oxygen Delivery Method: Non-rebreather mask

## 2018-11-28 NOTE — Op Note (Signed)
[facility] Patient Name: Joshua Dudley Procedure Date: 11/28/2018 7:47 AM MRN: [id number] Date of Birth: 06/11/1966 Attending MD: Norvel Richards , MD CSN: [id number] Age: 53 Admit Type: Outpatient Procedure:                Colonoscopy Indications:              Screening patient at increased risk: Colorectal                            cancer in multiple 1st-degree relatives. Colorectal                            cancer in mother before age 24 Providers:                Norvel Richards, MD, Janeece Riggers, RN, Aram Candela Referring MD:              Medicines:                Propofol per Anesthesia Complications:            No immediate complications. Estimated Blood Loss:     Estimated blood loss was minimal. Procedure:                Pre-Anesthesia Assessment:                           - Prior to the procedure, a History and Physical                            was performed, and patient medications and                            allergies were reviewed. The patient's tolerance of                            previous anesthesia was also reviewed. The risks                            and benefits of the procedure and the sedation                            options and risks were discussed with the patient.                            All questions were answered, and informed consent                            was obtained. Prior Anticoagulants: The patient has                            taken no previous anticoagulant or antiplatelet  agents. ASA Grade Assessment: II - A patient with                            mild systemic disease. After reviewing the risks                            and benefits, the patient was deemed in                            satisfactory condition to undergo the procedure.                           After obtaining informed consent, the colonoscope                            was passed under direct  vision. Throughout the                            procedure, the patient's blood pressure, pulse, and                            oxygen saturations were monitored continuously. The                            CF-[id number] ([id number]) scope was introduced through                            the and advanced to the the cecum, identified by                            appendiceal orifice and ileocecal valve. The                            colonoscopy was performed without difficulty. The                            patient tolerated the procedure well. The quality                            of the bowel preparation was adequate. The                            ileocecal valve, appendiceal orifice, and rectum                            were photographed. The entire colon was well                            visualized. Scope In: 8:02:59 AM Scope Out: 8:18:44 AM Scope Withdrawal Time: 0 hours 12 minutes 50 seconds  Total Procedure Duration: 0 hours 15 minutes 45 seconds  Findings:      The perianal and digital rectal examinations were normal.      A few small-mouthed diverticula were found in the entire colon.  Three sessile polyps were found in the splenic flexure and ascending       colon. The polyps were 5 to 7 mm in size. These polyps were removed with       a cold snare. Resection and retrieval were complete. Estimated blood       loss was minimal.      Non-bleeding internal hemorrhoids were found during retroflexion.      The exam was otherwise without abnormality on direct and retroflexion       views. Impression:               - Diverticulosis in the entire examined colon.                           - Three 5 to 7 mm polyps at the splenic flexure and                            in the ascending colon, removed with a cold snare.                            Resected and retrieved.                           - The examination was otherwise normal on direct                            and  retroflexion views.                           - Non-bleeding internal hemorrhoids.                           - The examination was otherwise normal on direct                            and retroflexion views. Moderate Sedation:      Moderate (conscious) sedation was personally administered by an       anesthesia professional. The following parameters were monitored: oxygen       saturation, heart rate, blood pressure, respiratory rate, EKG, adequacy       of pulmonary ventilation, and response to care. Recommendation:           - Patient has a contact number available for                            emergencies. The signs and symptoms of potential                            delayed complications were discussed with the                            patient. Return to normal activities tomorrow.                            Written discharge instructions were provided to the  patient.                           - Resume previous diet.                           - Continue present medications.                           - Repeat colonoscopy date to be determined after                            pending pathology results are reviewed for                            surveillance based on pathology results.                           - Return to GI office (date not yet determined). Procedure Code(s):        --- Professional ---                           [phone], Colonoscopy, flexible; with removal of                            tumor(s), polyp(s), or other lesion(s) by snare                            technique Diagnosis Code(s):        --- Professional ---                           K64.8, Other hemorrhoids                           D12.3, Benign neoplasm of transverse colon (hepatic                            flexure or splenic flexure)                           D12.2, Benign neoplasm of ascending colon                           Z80.0, Family history of malignant neoplasm of                             digestive organs                           K57.30, Diverticulosis of large intestine without                            perforation or abscess without bleeding CPT copyright 2018 American Medical Association. All rights reserved. The codes documented in this report are preliminary and upon coder review may  be revised to meet current compliance requirements. Cristopher Estimable. Gala Romney, MD Herbie Baltimore  Garfield Cornea, MD 11/28/2018 8:25:47 AM This report has been signed electronically. Number of Addenda: 0

## 2018-11-28 NOTE — Anesthesia Preprocedure Evaluation (Signed)
Anesthesia Evaluation  Patient identified by MRN, date of birth, ID band Patient awake    Reviewed: Allergy & Precautions, H&P , NPO status , Patient's Chart, lab work & pertinent test results, reviewed documented beta blocker date and time   Airway Mallampati: II  TM Distance: >3 FB Neck ROM: full    Dental no notable dental hx.    Pulmonary neg pulmonary ROS, Current Smoker,    Pulmonary exam normal breath sounds clear to auscultation       Cardiovascular Exercise Tolerance: Good negative cardio ROS   Rhythm:regular Rate:Normal     Neuro/Psych negative neurological ROS  negative psych ROS   GI/Hepatic negative GI ROS, Neg liver ROS,   Endo/Other  negative endocrine ROSdiabetes  Renal/GU negative Renal ROS  negative genitourinary   Musculoskeletal   Abdominal   Peds  Hematology negative hematology ROS (+)   Anesthesia Other Findings   Reproductive/Obstetrics negative OB ROS                             Anesthesia Physical Anesthesia Plan  ASA: II  Anesthesia Plan: MAC   Post-op Pain Management:    Induction:   PONV Risk Score and Plan:   Airway Management Planned:   Additional Equipment:   Intra-op Plan:   Post-operative Plan:   Informed Consent: I have reviewed the patients History and Physical, chart, labs and discussed the procedure including the risks, benefits and alternatives for the proposed anesthesia with the patient or authorized representative who has indicated his/her understanding and acceptance.     Dental Advisory Given  Plan Discussed with: CRNA  Anesthesia Plan Comments:         Anesthesia Quick Evaluation

## 2018-11-28 NOTE — Discharge Instructions (Signed)
Colonoscopy Discharge Instructions  Read the instructions outlined below and refer to this sheet in the next few weeks. These discharge instructions provide you with general information on caring for yourself after you leave the hospital. Your doctor may also give you specific instructions. While your treatment has been planned according to the most current medical practices available, unavoidable complications occasionally occur. If you have any problems or questions after discharge, call Dr. Gala Romney at [phone]. ACTIVITY  You may resume your regular activity, but move at a slower pace for the next 24 hours.   Take frequent rest periods for the next 24 hours.   Walking will help get rid of the air and reduce the bloated feeling in your belly (abdomen).   No driving for 24 hours (because of the medicine (anesthesia) used during the test).    Do not sign any important legal documents or operate any machinery for 24 hours (because of the anesthesia used during the test).  NUTRITION  Drink plenty of fluids.   You may resume your normal diet as instructed by your doctor.   Begin with a light meal and progress to your normal diet. Heavy or fried foods are harder to digest and may make you feel sick to your stomach (nauseated).   Avoid alcoholic beverages for 24 hours or as instructed.  MEDICATIONS  You may resume your normal medications unless your doctor tells you otherwise.  WHAT YOU CAN EXPECT TODAY  Some feelings of bloating in the abdomen.   Passage of more gas than usual.   Spotting of blood in your stool or on the toilet paper.  IF YOU HAD POLYPS REMOVED DURING THE COLONOSCOPY:  No aspirin products for 7 days or as instructed.   No alcohol for 7 days or as instructed.   Eat a soft diet for the next 24 hours.  FINDING OUT THE RESULTS OF YOUR TEST Not all test results are available during your visit. If your test results are not back during the visit, make an appointment  with your caregiver to find out the results. Do not assume everything is normal if you have not heard from your caregiver or the medical facility. It is important for you to follow up on all of your test results.  SEEK IMMEDIATE MEDICAL ATTENTION IF:  You have more than a spotting of blood in your stool.   Your belly is swollen (abdominal distention).   You are nauseated or vomiting.   You have a temperature over 101.   You have abdominal pain or discomfort that is severe or gets worse throughout the day.   Colon polyp and diverticulosis information provided  Further recommendations to follow pending review of pathology report    Colon Polyps  Polyps are tissue growths inside the body. Polyps can grow in many places, including the large intestine (colon). A polyp may be a round bump or a mushroom-shaped growth. You could have one polyp or several. Most colon polyps are noncancerous (benign). However, some colon polyps can become cancerous over time. Finding and removing the polyps early can help prevent this. What are the causes? The exact cause of colon polyps is not known. What increases the risk? You are more likely to develop this condition if you:  Have a family history of colon cancer or colon polyps.  Are older than 47 or older than 45 if you are African American.  Have inflammatory bowel disease, such as ulcerative colitis or Crohn's disease.  Have certain hereditary  conditions, such as: ? Familial adenomatous polyposis. ? Lynch syndrome. ? Turcot syndrome. ? Peutz-Jeghers syndrome.  Are overweight.  Smoke cigarettes.  Do not get enough exercise.  Drink too much alcohol.  Eat a diet that is high in fat and red meat and low in fiber.  Had childhood cancer that was treated with abdominal radiation. What are the signs or symptoms? Most polyps do not cause symptoms. If you have symptoms, they may include:  Blood coming from your rectum when having a bowel  movement.  Blood in your stool. The stool may look dark red or black.  Abdominal pain.  A change in bowel habits, such as constipation or diarrhea. How is this diagnosed? This condition is diagnosed with a colonoscopy. This is a procedure in which a lighted, flexible scope is inserted into the anus and then passed into the colon to examine the area. Polyps are sometimes found when a colonoscopy is done as part of routine cancer screening tests. How is this treated? Treatment for this condition involves removing any polyps that are found. Most polyps can be removed during a colonoscopy. Those polyps will then be tested for cancer. Additional treatment may be needed depending on the results of testing. Follow these instructions at home: Lifestyle  Maintain a healthy weight, or lose weight if recommended by your health care provider.  Exercise every day or as told by your health care provider.  Do not use any products that contain nicotine or tobacco, such as cigarettes and e-cigarettes. If you need help quitting, ask your health care provider.  If you drink alcohol, limit how much you have: ? 0-1 drink a day for women. ? 0-2 drinks a day for men.  Be aware of how much alcohol is in your drink. In the [place] one drink equals one 12 oz bottle of beer (355 mL), one 5 oz glass of wine (148 mL), or one 1 oz shot of hard liquor (44 mL). Eating and drinking   Eat foods that are high in fiber, such as fruits, vegetables, and whole grains.  Eat foods that are high in calcium and vitamin D, such as milk, cheese, yogurt, eggs, liver, fish, and broccoli.  Limit foods that are high in fat, such as fried foods and desserts.  Limit the amount of red meat and processed meat you eat, such as hot dogs, sausage, bacon, and lunch meats. General instructions  Keep all follow-up visits as told by your health care provider. This is important. ? This includes having regularly scheduled  colonoscopies. ? Talk to your health care provider about when you need a colonoscopy. Contact a health care provider if:  You have new or worsening bleeding during a bowel movement.  You have new or increased blood in your stool.  You have a change in bowel habits.  You lose weight for no known reason. Summary  Polyps are tissue growths inside the body. Polyps can grow in many places, including the colon.  Most colon polyps are noncancerous (benign), but some can become cancerous over time.  This condition is diagnosed with a colonoscopy.  Treatment for this condition involves removing any polyps that are found. Most polyps can be removed during a colonoscopy. This information is not intended to replace advice given to you by your health care provider. Make sure you discuss any questions you have with your health care provider. Document Released: 07/05/2004 Document Revised: 01/24/2018 Document Reviewed: 01/24/2018 Elsevier Interactive Patient Education  2019 [employer].  Diverticulosis ° °Diverticulosis is a condition that develops when small pouches (diverticula) form in the wall of the large intestine (colon). The colon is where water is absorbed and stool is formed. The pouches form when the inside layer of the colon pushes through weak spots in the outer layers of the colon. You may have a few pouches or many of them. °What are the causes? °The cause of this condition is not known. °What increases the risk? °The following factors may make you more likely to develop this condition: °· Being older than age 60. Your risk for this condition increases with age. Diverticulosis is rare among people younger than age 30. By age 80, many people have it. °· Eating a low-fiber diet. °· Having frequent constipation. °· Being overweight. °· Not getting enough exercise. °· Smoking. °· Taking over-the-counter pain medicines, like aspirin and ibuprofen. °· Having a family history of  diverticulosis. °What are the signs or symptoms? °In most people, there are no symptoms of this condition. If you do have symptoms, they may include: °· Bloating. °· Cramps in the abdomen. °· Constipation or diarrhea. °· Pain in the lower left side of the abdomen. °How is this diagnosed? °This condition is most often diagnosed during an exam for other colon problems. Because diverticulosis usually has no symptoms, it often cannot be diagnosed independently. This condition may be diagnosed by: °· Using a flexible scope to examine the colon (colonoscopy). °· Taking an X-ray of the colon after dye has been put into the colon (barium enema). °· Doing a CT scan. °How is this treated? °You may not need treatment for this condition if you have never developed an infection related to diverticulosis. If you have had an infection before, treatment may include: °· Eating a high-fiber diet. This may include eating more fruits, vegetables, and grains. °· Taking a fiber supplement. °· Taking a live bacteria supplement (probiotic). °· Taking medicine to relax your colon. °· Taking antibiotic medicines. °Follow these instructions at home: °· Drink 6-8 glasses of water or more each day to prevent constipation. °· Try not to strain when you have a bowel movement. °· If you have had an infection before: °? Eat more fiber as directed by your health care provider or your diet and nutrition specialist (dietitian). °? Take a fiber supplement or probiotic, if your health care provider approves. °· Take over-the-counter and prescription medicines only as told by your health care provider. °· If you were prescribed an antibiotic, take it as told by your health care provider. Do not stop taking the antibiotic even if you start to feel better. °· Keep all follow-up visits as told by your health care provider. This is important. °Contact a health care provider if: °· You have pain in your abdomen. °· You have bloating. °· You have  cramps. °· You have not had a bowel movement in 3 days. °Get help right away if: °· Your pain gets worse. °· Your bloating becomes very bad. °· You have a fever or chills, and your symptoms suddenly get worse. °· You vomit. °· You have bowel movements that are bloody or black. °· You have bleeding from your rectum. °Summary °· Diverticulosis is a condition that develops when small pouches (diverticula) form in the wall of the large intestine (colon). °· You may have a few pouches or many of them. °· This condition is most often diagnosed during an exam for other colon problems. °· If you have had an infection related   to diverticulosis, treatment may include increasing the fiber in your diet, taking supplements, or taking medicines. This information is not intended to replace advice given to you by your health care provider. Make sure you discuss any questions you have with your health care provider. Document Released: 07/06/2004 Document Revised: 08/28/2016 Document Reviewed: 08/28/2016 Elsevier Interactive Patient Education  2019 [place], Care After These instructions provide you with information about caring for yourself after your procedure. Your health care provider may also give you more specific instructions. Your treatment has been planned according to current medical practices, but problems sometimes occur. Call your health care provider if you have any problems or questions after your procedure. What can I expect after the procedure? After your procedure, you may:  Feel sleepy for several hours.  Feel clumsy and have poor balance for several hours.  Feel forgetful about what happened after the procedure.  Have poor judgment for several hours.  Feel nauseous or vomit.  Have a sore throat if you had a breathing tube during the procedure. Follow these instructions at home: For at least 24 hours after the procedure:      Have a responsible adult  stay with you. It is important to have someone help care for you until you are awake and alert.  Rest as needed.  Do not: ? Participate in activities in which you could fall or become injured. ? Drive. ? Use heavy machinery. ? Drink alcohol. ? Take sleeping pills or medicines that cause drowsiness. ? Make important decisions or sign legal documents. ? Take care of children on your own. Eating and drinking  Follow the diet that is recommended by your health care provider.  If you vomit, drink water, juice, or soup when you can drink without vomiting.  Make sure you have little or no nausea before eating solid foods. General instructions  Take over-the-counter and prescription medicines only as told by your health care provider.  If you have sleep apnea, surgery and certain medicines can increase your risk for breathing problems. Follow instructions from your health care provider about wearing your sleep device: ? Anytime you are sleeping, including during daytime naps. ? While taking prescription pain medicines, sleeping medicines, or medicines that make you drowsy.  If you smoke, do not smoke without supervision.  Keep all follow-up visits as told by your health care provider. This is important. Contact a health care provider if:  You keep feeling nauseous or you keep vomiting.  You feel light-headed.  You develop a rash.  You have a fever. Get help right away if:  You have trouble breathing. Summary  For several hours after your procedure, you may feel sleepy and have poor judgment.  Have a responsible adult stay with you for at least 24 hours or until you are awake and alert. This information is not intended to replace advice given to you by your health care provider. Make sure you discuss any questions you have with your health care provider. Document Released: 01/30/2016 Document Revised: 05/25/2017 Document Reviewed: 01/30/2016 Elsevier Interactive Patient  Education  2019 [employer].

## 2018-11-28 NOTE — Interval H&P Note (Signed)
History and Physical Interval Note:  11/28/2018 7:51 AM  Joshua Dudley  has presented today for surgery, with the diagnosis of screening colonoscopy  The various methods of treatment have been discussed with the patient and family. After consideration of risks, benefits and other options for treatment, the patient has consented to  Procedure(s) with comments: COLONOSCOPY WITH PROPOFOL (N/A) - 8:15am as a surgical intervention .  The patient's history has been reviewed, patient examined, no change in status, stable for surgery.  I have reviewed the patient's chart and labs.  Questions were answered to the patient's satisfaction.     [name]  No change.  Patient here for first ever screening colonoscopy.  The risks, benefits, limitations, alternatives and imponderables have been reviewed with the patient. Questions have been answered. All parties are agreeable.

## 2018-11-30 ENCOUNTER — Encounter: Payer: Self-pay | Admitting: Internal Medicine

## 2018-12-04 ENCOUNTER — Encounter (HOSPITAL_COMMUNITY): Payer: Self-pay | Admitting: Internal Medicine

## 2019-12-22 ENCOUNTER — Other Ambulatory Visit: Payer: Self-pay

## 2019-12-22 ENCOUNTER — Ambulatory Visit (HOSPITAL_COMMUNITY)
Admission: RE | Admit: 2019-12-22 | Discharge: 2019-12-22 | Disposition: A | Discharge status: Home / Self Care | Payer: 59 | Source: Ambulatory Visit | Attending: Internal Medicine | Admitting: Internal Medicine

## 2019-12-22 ENCOUNTER — Other Ambulatory Visit (HOSPITAL_COMMUNITY): Payer: Self-pay | Admitting: Internal Medicine

## 2019-12-22 DIAGNOSIS — M79642 Pain in left hand: Secondary | ICD-10-CM

## 2019-12-22 DIAGNOSIS — S6990XA Unspecified injury of unspecified wrist, hand and finger(s), initial encounter: Secondary | ICD-10-CM | POA: Insufficient documentation

## 2019-12-22 DIAGNOSIS — R609 Edema, unspecified: Secondary | ICD-10-CM | POA: Diagnosis present

## 2023-11-01 ENCOUNTER — Encounter: Payer: Self-pay | Admitting: *Deleted
